# Patient Record
Sex: Male | Born: 1990 | Race: Black or African American | Hispanic: No | Marital: Single | Smoking: Current every day smoker
Health system: Southern US, Community
[De-identification: ages and names within clinical notes are randomized; demographics above are authoritative.]

## PROBLEM LIST (undated history)

## (undated) DIAGNOSIS — B20 Human immunodeficiency virus [HIV] disease: Secondary | ICD-10-CM

## (undated) DIAGNOSIS — K529 Noninfective gastroenteritis and colitis, unspecified: Secondary | ICD-10-CM

## (undated) DIAGNOSIS — B353 Tinea pedis: Secondary | ICD-10-CM

## (undated) DIAGNOSIS — R590 Localized enlarged lymph nodes: Secondary | ICD-10-CM

## (undated) DIAGNOSIS — K1370 Unspecified lesions of oral mucosa: Secondary | ICD-10-CM

## (undated) DIAGNOSIS — M130 Polyarthritis, unspecified: Secondary | ICD-10-CM

## (undated) DIAGNOSIS — A549 Gonococcal infection, unspecified: Secondary | ICD-10-CM

## (undated) DIAGNOSIS — J029 Acute pharyngitis, unspecified: Secondary | ICD-10-CM

## (undated) DIAGNOSIS — Z8619 Personal history of other infectious and parasitic diseases: Secondary | ICD-10-CM

## (undated) HISTORY — DX: Noninfective gastroenteritis and colitis, unspecified: K52.9

## (undated) HISTORY — DX: Tinea pedis: B35.3

## (undated) HISTORY — DX: Localized enlarged lymph nodes: R59.0

## (undated) HISTORY — DX: Human immunodeficiency virus (HIV) disease: B20

## (undated) HISTORY — DX: Unspecified lesions of oral mucosa: K13.70

## (undated) HISTORY — DX: Polyarthritis, unspecified: M13.0

## (undated) HISTORY — DX: Acute pharyngitis, unspecified: J02.9

## (undated) HISTORY — DX: Personal history of other infectious and parasitic diseases: Z86.19

## (undated) HISTORY — DX: Gonococcal infection, unspecified: A54.9

---

## 2013-04-16 ENCOUNTER — Emergency Department (HOSPITAL_COMMUNITY): Payer: Self-pay

## 2013-04-16 ENCOUNTER — Emergency Department (HOSPITAL_COMMUNITY)
Admission: EM | Admit: 2013-04-16 | Discharge: 2013-04-16 | Disposition: A | Discharge status: Home / Self Care | Payer: Self-pay | Attending: Emergency Medicine | Admitting: Emergency Medicine

## 2013-04-16 ENCOUNTER — Encounter (HOSPITAL_COMMUNITY): Payer: Self-pay

## 2013-04-16 DIAGNOSIS — L03039 Cellulitis of unspecified toe: Secondary | ICD-10-CM | POA: Insufficient documentation

## 2013-04-16 DIAGNOSIS — F17200 Nicotine dependence, unspecified, uncomplicated: Secondary | ICD-10-CM | POA: Insufficient documentation

## 2013-04-16 DIAGNOSIS — L02619 Cutaneous abscess of unspecified foot: Secondary | ICD-10-CM | POA: Insufficient documentation

## 2013-04-16 DIAGNOSIS — L03031 Cellulitis of right toe: Secondary | ICD-10-CM

## 2013-04-16 MED ORDER — HYDROCODONE-ACETAMINOPHEN 5-325 MG PO TABS
1.0000 | ORAL_TABLET | Freq: Once | ORAL | Status: AC
  Administered 2013-04-16: 1 via ORAL
  Filled 2013-04-16: qty 1

## 2013-04-16 MED ORDER — CEPHALEXIN 500 MG PO CAPS: 500.0000 mg | ORAL_CAPSULE | Freq: Three times a day (TID) | ORAL | Status: DC

## 2013-04-16 MED ORDER — SULFAMETHOXAZOLE-TRIMETHOPRIM 800-160 MG PO TABS: 1.0000 | ORAL_TABLET | Freq: Two times a day (BID) | ORAL | Status: DC

## 2013-04-16 NOTE — ED Provider Notes (Signed)
CSN: [id number]     Arrival date & time 04/16/13  0222 History   First MD Initiated Contact with Patient 04/16/13 0245     Chief Complaint  Patient presents with  . Foot Pain   (Consider location/radiation/quality/duration/timing/severity/associated sxs/prior Treatment) HPI  22 year old male presents complaining of right foot pain. Patient states he has been self treating his right foot for athlete's foot for the past several months. He also was seen at urgent care 1 month ago for this complaint and was given prescription pills which he has taken it with no improvement. For the past 2 days he noticed gradual sharp throbbing pain to his fourth toe on right foot. Pain has been persistent, worsening with weightbearing. He is here today for evaluations of this toe pain. No complaints of fever, chills. No history of diabetes. No recent trauma.  No past medical history on file. No past surgical history on file. No family history on file. History  Substance Use Topics  . Smoking status: Current Some Day Smoker    Types: Cigars  . Smokeless tobacco: Not on file  . Alcohol Use: Yes    Review of Systems  Constitutional: Negative for fever.  Musculoskeletal: Positive for myalgias.  Skin: Positive for rash.    Allergies  Review of patient's allergies indicates not on file.  Home Medications  No current outpatient prescriptions on file. BP 141/82  Pulse 83  Temp(Src) 97.8 F (36.6 C) (Oral)  Resp 20  SpO2 100% Physical Exam  Nursing note and vitals reviewed. Constitutional: He appears well-developed. No distress.  HENT:  Head: Atraumatic.  Eyes: Conjunctivae are normal.  Musculoskeletal: He exhibits tenderness (R foot: 4th toe is mildly edematous, erythematous, diffusedly tender, with lymphangitis extending to dorsum of foot.  macerated skin to webspace between both side of 4th toe.  No obvious abscess. no fb noted.  nail bed is normal, brisk cap refill).  Psychiatric: He has a  normal mood and affect.    ED Course  Procedures (including critical care time)  Patient with apparent skin infection to his fourth toe on right foot with evidence of lymphangitis. No obvious joint involvement. No obvious abscess noted. Macerated skins around the affected toe, likely the vector of infection.  Will obtain x-rays to rule out bony involvement. Pain medication given.  4:18 AM Xray without bony involvement.  Will d/c with keflex/bactrim abx.  appropriate skin care discussed.  Cellulitis hand out given.  Recommend return in 48 hx if no improvement.    Labs Review Labs Reviewed - No data to display Imaging Review Dg Toe 4th Right  04/16/2013   CLINICAL DATA:  Foot pain. Signs of infection. Rule out osteomyelitis.  EXAM: RIGHT FOURTH TOE  COMPARISON:  None.  FINDINGS: No ulcer is seen. No osseous erosion, demineralization, or focal joint narrowing.  IMPRESSION: No evidence of osseous infection.   Electronically Signed   By: Tiburcio Pea   On: 04/16/2013 03:51    MDM   1. Cellulitis of toe of right foot    BP 127/60  Pulse 66  Temp(Src) 97.8 F (36.6 C) (Oral)  Resp 16  SpO2 99%  I have reviewed nursing notes and vital signs. I personally reviewed the imaging tests through PACS system  I reviewed available ER/hospitalization records thought the EMR     Fayrene Helper, PA-C 04/16/13 0419  Fayrene Helper, PA-C 04/16/13 [phone]

## 2013-04-16 NOTE — ED Provider Notes (Signed)
Medical screening examination/treatment/procedure(s) were performed by non-physician practitioner and as supervising physician I was immediately available for consultation/collaboration.  Sunnie Nielsen, MD 04/16/13 [phone]

## 2013-04-16 NOTE — ED Notes (Signed)
Pt has been using creams and prescription pills to treat athletes foot for one week and is now experiencing increased pain to R foot. Pt states he has noticed a green spot on toe that went away and has skin peeling to toes.

## 2013-04-18 ENCOUNTER — Emergency Department (HOSPITAL_COMMUNITY)
Admission: EM | Admit: 2013-04-18 | Discharge: 2013-04-18 | Disposition: A | Discharge status: Home / Self Care | Payer: Self-pay | Attending: Emergency Medicine | Admitting: Emergency Medicine

## 2013-04-18 ENCOUNTER — Encounter (HOSPITAL_COMMUNITY): Payer: Self-pay | Admitting: Emergency Medicine

## 2013-04-18 DIAGNOSIS — M25473 Effusion, unspecified ankle: Secondary | ICD-10-CM | POA: Insufficient documentation

## 2013-04-18 DIAGNOSIS — L03039 Cellulitis of unspecified toe: Secondary | ICD-10-CM | POA: Insufficient documentation

## 2013-04-18 DIAGNOSIS — L02619 Cutaneous abscess of unspecified foot: Secondary | ICD-10-CM | POA: Insufficient documentation

## 2013-04-18 DIAGNOSIS — L02611 Cutaneous abscess of right foot: Secondary | ICD-10-CM

## 2013-04-18 DIAGNOSIS — M25476 Effusion, unspecified foot: Secondary | ICD-10-CM | POA: Insufficient documentation

## 2013-04-18 DIAGNOSIS — L03031 Cellulitis of right toe: Secondary | ICD-10-CM

## 2013-04-18 DIAGNOSIS — F17200 Nicotine dependence, unspecified, uncomplicated: Secondary | ICD-10-CM | POA: Insufficient documentation

## 2013-04-18 DIAGNOSIS — B353 Tinea pedis: Secondary | ICD-10-CM

## 2013-04-18 MED ORDER — KETOROLAC TROMETHAMINE 30 MG/ML IJ SOLN
30.0000 mg | Freq: Once | INTRAMUSCULAR | Status: AC
Start: 2013-04-18 — End: 2013-04-18
  Administered 2013-04-18: 30 mg via INTRAVENOUS
  Filled 2013-04-18: qty 1

## 2013-04-18 MED ORDER — MORPHINE SULFATE 4 MG/ML IJ SOLN
4.0000 mg | Freq: Once | INTRAMUSCULAR | Status: AC
  Administered 2013-04-18: 4 mg via INTRAVENOUS
  Filled 2013-04-18: qty 1

## 2013-04-18 MED ORDER — CLINDAMYCIN PHOSPHATE 600 MG/50ML IV SOLN
600.0000 mg | Freq: Once | INTRAVENOUS | Status: AC
  Administered 2013-04-18: 600 mg via INTRAVENOUS
  Filled 2013-04-18: qty 50

## 2013-04-18 MED ORDER — HYDROCODONE-ACETAMINOPHEN 5-325 MG PO TABS: ORAL_TABLET | ORAL | Status: DC

## 2013-04-18 MED ORDER — TOLNAFTATE 1 % EX POWD: Freq: Two times a day (BID) | CUTANEOUS | Status: DC

## 2013-04-18 NOTE — ED Notes (Signed)
Pt states he was recently diagnosed with cellulitis of R foot/toes, pt states he has been using meds as directed, feel infection and pain now worse.

## 2013-04-18 NOTE — Discharge Instructions (Signed)
 Cellulitis Cellulitis is an infection of the skin and the tissue beneath it. The infected area is usually red and tender. Cellulitis occurs most often in the arms and lower legs.  CAUSES  Cellulitis is caused by bacteria that enter the skin through cracks or cuts in the skin. The most common types of bacteria that cause cellulitis are Staphylococcus and Streptococcus. SYMPTOMS   Redness and warmth.  Swelling.  Tenderness or pain.  Fever. DIAGNOSIS  Your caregiver can usually determine what is wrong based on a physical exam. Blood tests may also be done. TREATMENT  Treatment usually involves taking an antibiotic medicine. HOME CARE INSTRUCTIONS   Take your antibiotics as directed. Finish them even if you start to feel better.  Keep the infected arm or leg elevated to reduce swelling.  Apply a warm cloth to the affected area up to 4 times per day to relieve pain.  Only take over-the-counter or prescription medicines for pain, discomfort, or fever as directed by your caregiver.  Keep all follow-up appointments as directed by your caregiver. SEEK MEDICAL CARE IF:   You notice red streaks coming from the infected area.  Your red area gets larger or turns dark in color.  Your bone or joint underneath the infected area becomes painful after the skin has healed.  Your infection returns in the same area or another area.  You notice a swollen bump in the infected area.  You develop new symptoms. SEEK IMMEDIATE MEDICAL CARE IF:   You have a fever.  You feel very sleepy.  You develop vomiting or diarrhea.  You have a general ill feeling (malaise) with muscle aches and pains. MAKE SURE YOU:   Understand these instructions.  Will watch your condition.  Will get help right away if you are not doing well or get worse. Document Released: 04/25/2005 Document Revised: 01/15/2012 Document Reviewed: 10/01/2011 [facility] Patient Information 2014 [place], [place].     Athlete's  Foot Athlete's foot (tinea pedis) is a fungal infection of the skin on the feet. It often occurs on the skin between the toes or underneath the toes. It can also occur on the soles of the feet. Athlete's foot is more likely to occur in hot, humid weather. Not washing your feet or changing your socks often enough can contribute to athlete's foot. The infection can spread from person to person (contagious). CAUSES Athlete's foot is caused by a fungus. This fungus thrives in warm, moist places. Most people get athlete's foot by sharing shower stalls, towels, and wet floors with an infected person. People with weakened immune systems, including those with diabetes, may be more likely to get athlete's foot. SYMPTOMS   Itchy areas between the toes or on the soles of the feet.  White, flaky, or scaly areas between the toes or on the soles of the feet.  Tiny, intensely itchy blisters between the toes or on the soles of the feet.  Tiny cuts on the skin. These cuts can develop a bacterial infection.  Thick or discolored toenails. DIAGNOSIS  Your caregiver can usually tell what the problem is by doing a physical exam. Your caregiver may also take a skin sample from the rash area. The skin sample may be examined under a microscope, or it may be tested to see if fungus will grow in the sample. A sample may also be taken from your toenail for testing. TREATMENT  Over-the-counter and prescription medicines can be used to kill the fungus. These medicines are available as  powders or creams. Your caregiver can suggest medicines for you. Fungal infections respond slowly to treatment. You may need to continue using your medicine for several weeks. PREVENTION   Do not share towels.  Wear sandals in wet areas, such as shared locker rooms and shared showers.  Keep your feet dry. Wear shoes that allow air to circulate. Wear cotton or wool socks. HOME CARE INSTRUCTIONS   Take medicines as directed by your  caregiver. Do not use steroid creams on athlete's foot.  Keep your feet clean and cool. Wash your feet daily and dry them thoroughly, especially between your toes.  Change your socks every day. Wear cotton or wool socks. In hot climates, you may need to change your socks 2 to 3 times per day.  Wear sandals or canvas tennis shoes with good air circulation.  If you have blisters, soak your feet in Burow's solution or Epsom salts for 20 to 30 minutes, 2 times a day to dry out the blisters. Make sure you dry your feet thoroughly afterward. SEEK MEDICAL CARE IF:   You have a fever.  You have swelling, soreness, warmth, or redness in your foot.  You are not getting better after 7 days of treatment.  You are not completely cured after 30 days.  You have any problems caused by your medicines. MAKE SURE YOU:   Understand these instructions.  Will watch your condition.  Will get help right away if you are not doing well or get worse. Document Released: 07/13/2000 Document Revised: 10/08/2011 Document Reviewed: 05/04/2011 [facility] Patient Information 2014 [place], [place].   Narcotic and benzodiazepine use may cause drowsiness, slowed breathing or dependence.  Please use with caution and do not drive, operate machinery or watch young children alone while taking them.  Taking combinations of these medications or drinking alcohol will potentiate these effects.

## 2013-04-18 NOTE — ED Provider Notes (Signed)
CSN: [id number]     Arrival date & time 04/18/13  8295 History   First MD Initiated Contact with Patient 04/18/13 [phone]     Chief Complaint  Patient presents with  . Cellulitis   (Consider location/radiation/quality/duration/timing/severity/associated sxs/prior Treatment) HPI Comments: Pt with h/o athletes foot problems along skin had worsening pain and swelling to 4th toe of right foot, seen 2 days ago, I reviewed chart and pt had plain film, given 2 abx, keflex and bactrim.  Pt denies h/o DM, other health problems.  He has been taking tylenol for pain without much relief.  He is wearing sandals, normally wears shoes and socks all day.  He was given pills for athletes foot in the past, has completed all.  He reports last night, pain got worse, sharp, had pus drainage from in between 3rd and 4th toes.    Patient is a 22 y.o. male presenting with lower extremity pain. The history is provided by the patient, medical records and a friend.  Foot Pain This is a new problem. The current episode started more than 2 days ago. The problem occurs constantly. The problem has been gradually worsening.    History reviewed. No pertinent past medical history. History reviewed. No pertinent past surgical history. History reviewed. No pertinent family history. History  Substance Use Topics  . Smoking status: Current Some Day Smoker    Types: Cigars  . Smokeless tobacco: Not on file  . Alcohol Use: Yes     Comment: social    Review of Systems  Constitutional: Negative for fever and chills.  Musculoskeletal: Positive for joint swelling and arthralgias.  Skin: Positive for color change.  All other systems reviewed and are negative.    Allergies  Review of patient's allergies indicates no known allergies.  Home Medications   Current Outpatient Rx  Name  Route  Sig  Dispense  Refill  . acetaminophen (TYLENOL) 500 MG tablet   Oral   Take 1,000 mg by mouth every 6 (six) hours as needed for pain.         . cephALEXin (KEFLEX) 500 MG capsule   Oral   Take 1 capsule (500 mg total) by mouth 3 (three) times daily.   21 capsule   0   . sulfamethoxazole-trimethoprim (BACTRIM DS,SEPTRA DS) 800-160 MG per tablet   Oral   Take 1 tablet by mouth 2 (two) times daily.   10 tablet   0   . HYDROcodone-acetaminophen (NORCO/VICODIN) 5-325 MG per tablet      1-2 tablets po q 6 hours prn moderate to severe pain   20 tablet   0   . tolnaftate (TINACTIN) 1 % powder   Topical   Apply topically 2 (two) times daily.   45 g   0    BP 131/64  Pulse 64  Temp(Src) 97.8 F (36.6 C) (Oral)  Ht 5\' 6"  (1.676 m)  Wt 140 lb (63.504 kg)  BMI 22.61 kg/m2  SpO2 100% Physical Exam  Nursing note and vitals reviewed. Constitutional: He is oriented to person, place, and time. He appears well-developed and well-nourished. No distress.  HENT:  Head: Normocephalic and atraumatic.  Cardiovascular:  Pulses:      Dorsalis pedis pulses are 2+ on the right side.       Posterior tibial pulses are 2+ on the right side.  Pulmonary/Chest: Effort normal. No respiratory distress.  Abdominal: Soft. He exhibits no distension. There is no tenderness. There is no rebound and no guarding.  Musculoskeletal:       Right foot: He exhibits tenderness and swelling.       Feet:  Neurological: He is alert and oriented to person, place, and time. Coordination normal.  Skin: Skin is warm and dry. He is not diaphoretic.    ED Course  Procedures (including critical care time) Labs Review Labs Reviewed - No data to display Imaging Review No results found.    9:19 AM All instructions given . Pt feels improved overall.  Has some minimal upper epigastric pain, suspect not related, no guard or rebound.  Pt has not yet eaten breakfast and has had pills this AM.  Pt is reassured.   Will refer to podiatry and d/c after abx completed. MDM   1. Cellulitis of toe of right foot   2. Abscess of toe of right foot   3.  Tinea pedis, right    Pt with worsening cellulitis, spontaneous drainage of some abscess fluid from between toes and into foot space.  No further fluctuance noted.  With some lymph channel streaking, will give IV dose of clindamycin, clean foot and toes well here, give antifungal powder and stressed keeping foot clean to patient.     Gavin Pound. Oletta Lamas, MD 04/18/13 [phone]

## 2014-10-28 ENCOUNTER — Ambulatory Visit (INDEPENDENT_AMBULATORY_CARE_PROVIDER_SITE_OTHER): Payer: Self-pay

## 2014-10-28 DIAGNOSIS — Z113 Encounter for screening for infections with a predominantly sexual mode of transmission: Secondary | ICD-10-CM

## 2014-10-28 DIAGNOSIS — B20 Human immunodeficiency virus [HIV] disease: Secondary | ICD-10-CM

## 2014-10-28 DIAGNOSIS — Z79899 Other long term (current) drug therapy: Secondary | ICD-10-CM

## 2014-10-28 LAB — CBC WITH DIFFERENTIAL/PLATELET
BASOS PCT: 0 % (ref 0–1)
Basophils Absolute: 0 10*3/uL (ref 0.0–0.1)
Eosinophils Absolute: 0 10*3/uL (ref 0.0–0.7)
Eosinophils Relative: 1 % (ref 0–5)
HEMATOCRIT: 48 % (ref 39.0–52.0)
HEMOGLOBIN: 16.7 g/dL (ref 13.0–17.0)
LYMPHS ABS: 2 10*3/uL (ref 0.7–4.0)
Lymphocytes Relative: 41 % (ref 12–46)
MCH: 28.4 pg (ref 26.0–34.0)
MCHC: 34.8 g/dL (ref 30.0–36.0)
MCV: 81.5 fL (ref 78.0–100.0)
MONO ABS: 0.5 10*3/uL (ref 0.1–1.0)
MONOS PCT: 10 % (ref 3–12)
MPV: 9.3 fL (ref 8.6–12.4)
NEUTROS ABS: 2.3 10*3/uL (ref 1.7–7.7)
Neutrophils Relative %: 48 % (ref 43–77)
Platelets: 348 10*3/uL (ref 150–400)
RBC: 5.89 MIL/uL — ABNORMAL HIGH (ref 4.22–5.81)
RDW: 13.2 % (ref 11.5–15.5)
WBC: 4.8 10*3/uL (ref 4.0–10.5)

## 2014-10-29 LAB — COMPLETE METABOLIC PANEL WITH GFR
ALBUMIN: 5 g/dL (ref 3.5–5.2)
ALT: 17 U/L (ref 0–53)
AST: 17 U/L (ref 0–37)
Alkaline Phosphatase: 57 U/L (ref 39–117)
BUN: 9 mg/dL (ref 6–23)
CALCIUM: 10 mg/dL (ref 8.4–10.5)
CHLORIDE: 103 meq/L (ref 96–112)
CO2: 27 meq/L (ref 19–32)
Creat: 0.84 mg/dL (ref 0.50–1.35)
GFR, Est African American: >89 mL/min
GLUCOSE: 88 mg/dL (ref 70–99)
POTASSIUM: 4.1 meq/L (ref 3.5–5.3)
SODIUM: 139 meq/L (ref 135–145)
TOTAL PROTEIN: 8.3 g/dL (ref 6.0–8.3)
Total Bilirubin: 0.5 mg/dL (ref 0.2–1.2)

## 2014-10-29 LAB — HEPATITIS A ANTIBODY, TOTAL: HEP A TOTAL AB: NONREACTIVE

## 2014-10-29 LAB — LIPID PANEL
CHOL/HDL RATIO: 3.6 ratio
Cholesterol: 138 mg/dL (ref 0–200)
HDL: 38 mg/dL — ABNORMAL LOW (ref >=40)
LDL CALC: 80 mg/dL (ref 0–99)
Triglycerides: 98 mg/dL (ref <150)
VLDL: 20 mg/dL (ref 0–40)

## 2014-10-29 LAB — RPR

## 2014-10-29 LAB — HEPATITIS C ANTIBODY: HCV AB: NEGATIVE

## 2014-10-29 LAB — HEPATITIS B SURFACE ANTIBODY,QUALITATIVE: Hep B S Ab: POSITIVE — AB

## 2014-10-29 LAB — T-HELPER CELL (CD4) - (RCID CLINIC ONLY)
CD4 % Helper T Cell: 26 % — ABNORMAL LOW (ref 33–55)
CD4 T CELL ABS: 490 /uL (ref 400–2700)

## 2014-10-29 LAB — HEPATITIS B SURFACE ANTIGEN: HEP B S AG: NEGATIVE

## 2014-10-29 LAB — HEPATITIS B CORE ANTIBODY, TOTAL: Hep B Core Total Ab: NONREACTIVE

## 2014-10-30 LAB — QUANTIFERON TB GOLD ASSAY (BLOOD)
Interferon Gamma Release Assay: NEGATIVE
QUANTIFERON NIL VALUE: 0.19 [IU]/mL
QUANTIFERON TB AG MINUS NIL: 0.05 [IU]/mL
TB Ag value: 0.24 IU/mL

## 2014-11-01 LAB — HIV-1 RNA ULTRAQUANT REFLEX TO GENTYP+
HIV 1 RNA QUANT: 45990 {copies}/mL — AB (ref <20)
HIV-1 RNA QUANT, LOG: 4.66 {Log} — AB (ref <1.30)

## 2014-11-03 ENCOUNTER — Other Ambulatory Visit: Payer: Self-pay | Admitting: Licensed Clinical Social Worker

## 2014-11-03 MED ORDER — SULFAMETHOXAZOLE-TRIMETHOPRIM 800-160 MG PO TABS: 1.0000 | ORAL_TABLET | Freq: Two times a day (BID) | ORAL | Status: DC

## 2014-11-04 LAB — HLA B*5701: HLA-B 5701 W/RFLX HLA-B HIGH: NEGATIVE

## 2014-11-08 LAB — HIV-1 GENOTYPR PLUS

## 2014-11-09 ENCOUNTER — Telehealth: Payer: Self-pay | Admitting: *Deleted

## 2014-11-09 ENCOUNTER — Ambulatory Visit: Payer: Self-pay | Admitting: Internal Medicine

## 2014-11-09 NOTE — Telephone Encounter (Signed)
Message left on voicemail regarding missed new patient appointment.  Patient advised to call back to reschedule.   Laurell Josephsammy K Marki Frede, RN

## 2014-11-30 ENCOUNTER — Encounter: Payer: Self-pay | Admitting: Internal Medicine

## 2014-11-30 ENCOUNTER — Ambulatory Visit (INDEPENDENT_AMBULATORY_CARE_PROVIDER_SITE_OTHER): Payer: Self-pay | Admitting: Internal Medicine

## 2014-11-30 VITALS — BP 136/95 | HR 69 | Temp 99.5°F | Ht 66.5 in | Wt 139.0 lb

## 2014-11-30 DIAGNOSIS — R110 Nausea: Secondary | ICD-10-CM

## 2014-11-30 DIAGNOSIS — Z113 Encounter for screening for infections with a predominantly sexual mode of transmission: Secondary | ICD-10-CM

## 2014-11-30 DIAGNOSIS — Z23 Encounter for immunization: Secondary | ICD-10-CM

## 2014-11-30 DIAGNOSIS — B20 Human immunodeficiency virus [HIV] disease: Secondary | ICD-10-CM

## 2014-11-30 DIAGNOSIS — Z7189 Other specified counseling: Secondary | ICD-10-CM

## 2014-11-30 DIAGNOSIS — R634 Abnormal weight loss: Secondary | ICD-10-CM

## 2014-11-30 MED ORDER — PROMETHAZINE HCL 25 MG PO TABS: 25.0000 mg | ORAL_TABLET | Freq: Four times a day (QID) | ORAL | Status: DC | PRN

## 2014-11-30 NOTE — Progress Notes (Signed)
Patient ID: David Hoffman, male   DOB: July 29, 1991, 24 y.o.   MRN: [id number]       Patient ID: David Hoffman, male   DOB: 1991/07/22, 24 y.o.   MRN: [id number]  HPI 23yo M with HIV disease, CD 4 count of 490/VL45,990. Hep B immune, not immune to hep A. RPR negative. RF for HIV includes: MSM. HIV was diagnosed in the setting of attempting to donate plasma.  Last tested: found to be positive in march 2016. Last tested negative early January 2015. Had 3 sexual partners in 2015. His main partners x 3 yrs, now started on PreP   He has been in good state of health, works out but states he has poor dietary intake. In the last 4 days, he has had some nausea and roughly 10 days ago had one evening of nightsweats but no longer happening.  Hx of STI or HPV anal warts: 2014 had gonorrhea.   PMHx:  Hx of std Newly diagnosed hiv disease  Family hx: diabetes, hx of schizophrenia in grandmother  Soc hx: works in call center full time, originally from [place], commutes to [employer]. 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 Wants to work for the [employer]. Stopped smoking. occ ETOH.    There are no active problems to display for this patient.   Health Maintenance Due  Topic Date Due  . TETANUS/TDAP  01/25/2011     Review of Systems  nausea +, weight loss, lost 20 # in last 10 months, unintentional. Otherwise 10 point ROS is negative Physical Exam   BP 136/95 mmHg  Pulse 69  Temp(Src) 99.5 F (37.5 C) (Oral)  Ht 5' 6.5" (1.689 m)  Wt 139 lb (63.05 kg)  BMI 22.10 kg/m2 Physical Exam  Constitutional: He is oriented to person, place, and time. He appears well-developed and well-nourished. No distress.  HENT:  Mouth/Throat: Oropharynx is clear and moist. No oropharyngeal exudate.  Cardiovascular: Normal rate, regular rhythm and normal heart sounds. Exam reveals no gallop and no friction rub.  No murmur heard.  Pulmonary/Chest: Effort normal and breath sounds normal. No  respiratory distress. He has no wheezes.  Abdominal: Soft. Bowel sounds are normal. He exhibits no distension. There is no tenderness.  Lymphadenopathy:  He has no cervical adenopathy.  Neurological: He is alert and oriented to person, place, and time.  Skin: Skin is warm and dry. No rash noted. No erythema. Tattoos+ Psychiatric: He has a normal mood and affect. His behavior is normal.    Lab Results  Component Value Date   CD4TCELL 26* 10/28/2014   Lab Results  Component Value Date   CD4TABS 490 10/28/2014   Lab Results  Component Value Date   HIV1RNAQUANT 45990* 10/28/2014   Lab Results  Component Value Date   HEPBSAB POS* 10/28/2014   No results found for: RPR  CBC Lab Results  Component Value Date   WBC 4.8 10/28/2014   RBC 5.89* 10/28/2014   HGB 16.7 10/28/2014   HCT 48.0 10/28/2014   PLT 348 10/28/2014   MCV 81.5 10/28/2014   MCH 28.4 10/28/2014   MCHC 34.8 10/28/2014   RDW 13.2 10/28/2014   LYMPHSABS 2.0 10/28/2014   MONOABS 0.5 10/28/2014   EOSABS 0.0 10/28/2014   BASOSABS 0.0 10/28/2014   BMET Lab Results  Component Value Date   NA 139 10/28/2014   K 4.1 10/28/2014   CL 103 10/28/2014   CO2 27 10/28/2014   GLUCOSE 88 10/28/2014   BUN 9  10/28/2014   CREATININE 0.84 10/28/2014   CALCIUM 10.0 10/28/2014   GFRNONAA >89 10/28/2014   GFRAA >89 10/28/2014     Assessment and Plan  hiv disease= will have him talk to Selena [facility]attenKim for clinical trial. Will be doing study startingin integrase and following bone density  Nausea = will give rx of phenergan to see if that helps. Will work up if it is still persistent at next visit  Health maintenance & promotion =will give hep A #1 today, and pneumococcal today. Discuss getting hpv at next visit. Spent 5 min in face to face time in counseling on health promotion, safe sexual practices.   - will do urine gc/chlamydia  hiv prevention = condoms if to have sex  Weight loss = will encourage for him to have better  dietary intake and will monitor weight at next visit

## 2014-12-01 LAB — URINALYSIS, ROUTINE W REFLEX MICROSCOPIC
Bilirubin Urine: NEGATIVE
GLUCOSE, UA: NEGATIVE mg/dL
HGB URINE DIPSTICK: NEGATIVE
KETONES UR: NEGATIVE mg/dL
LEUKOCYTES UA: NEGATIVE
NITRITE: NEGATIVE
PH: 7.5 (ref 5.0–8.0)
Protein, ur: NEGATIVE mg/dL
SPECIFIC GRAVITY, URINE: 1.023 (ref 1.005–1.030)
UROBILINOGEN UA: 1 mg/dL (ref 0.0–1.0)

## 2014-12-01 LAB — URINE CYTOLOGY ANCILLARY ONLY
Chlamydia: NEGATIVE
Neisseria Gonorrhea: NEGATIVE

## 2014-12-07 ENCOUNTER — Encounter (INDEPENDENT_AMBULATORY_CARE_PROVIDER_SITE_OTHER): Payer: Self-pay | Admitting: *Deleted

## 2014-12-07 VITALS — BP 134/83 | HR 73 | Temp 97.7°F | Resp 16 | Ht 67.0 in | Wt 138.8 lb

## 2014-12-07 DIAGNOSIS — B20 Human immunodeficiency virus [HIV] disease: Secondary | ICD-10-CM

## 2014-12-07 DIAGNOSIS — Z006 Encounter for examination for normal comparison and control in clinical research program: Secondary | ICD-10-CM

## 2014-12-07 NOTE — Progress Notes (Signed)
Subjective:    Patient ID: David Hoffman, male    DOB: 03/26/1991, 24 y.o.   MRN: [id number]  HPI  24 year old [place] man with newly diagnosed HIV, screening visit for [facility] study.  Lab Results  Component Value Date   HIV1RNAQUANT 96759* 10/28/2014   Lab Results  Component Value Date   CD4TABS 490 10/28/2014    HIV genotype no resistance. HIV INSTI not yet done. Hep B negative. HLA b701 negative   Review of Systems  Constitutional: Negative for fever, chills, diaphoresis, activity change, appetite change, fatigue and unexpected weight change.  HENT: Negative for congestion, rhinorrhea, sinus pressure, sneezing, sore throat and trouble swallowing.   Eyes: Negative for photophobia and visual disturbance.  Respiratory: Negative for cough, chest tightness, shortness of breath, wheezing and stridor.   Cardiovascular: Negative for chest pain, palpitations and leg swelling.  Gastrointestinal: Negative for nausea, vomiting, abdominal pain, diarrhea, constipation, blood in stool, abdominal distention and anal bleeding.  Genitourinary: Negative for dysuria, hematuria, flank pain and difficulty urinating.  Musculoskeletal: Negative for myalgias, back pain, joint swelling, arthralgias and gait problem.  Skin: Negative for color change, pallor, rash and wound.  Neurological: Negative for dizziness, tremors, weakness and light-headedness.  Hematological: Negative for adenopathy. Does not bruise/bleed easily.  Psychiatric/Behavioral: Negative for behavioral problems, confusion, sleep disturbance, dysphoric mood, decreased concentration and agitation.       Objective:   Physical Exam  Constitutional: He is oriented to person, place, and time. He appears well-developed and well-nourished.  HENT:  Head: Normocephalic and atraumatic.  Right Ear: Hearing normal.  Left Ear: Hearing normal.  Nose: Nose normal. No mucosal edema.  Mouth/Throat: Uvula is midline, oropharynx is  clear and moist and mucous membranes are normal. He does not have dentures. No oral lesions. No trismus in the jaw. Normal dentition. No dental abscesses, uvula swelling, lacerations or dental caries. No oropharyngeal exudate, posterior oropharyngeal edema, posterior oropharyngeal erythema or tonsillar abscesses.  Eyes: Conjunctivae, EOM and lids are normal. Pupils are equal, round, and reactive to light. Right conjunctiva is not injected. Right conjunctiva has no hemorrhage. Left conjunctiva is not injected. Left conjunctiva has no hemorrhage. No scleral icterus.  Neck: Trachea normal and normal range of motion. Neck supple. No JVD present. No thyroid mass and no thyromegaly present.  Cardiovascular: Normal rate, regular rhythm and normal heart sounds.  Exam reveals no gallop and no friction rub.   No murmur heard. Pulmonary/Chest: Effort normal and breath sounds normal. No respiratory distress. He has no wheezes. He has no rales.  Abdominal: Soft. Bowel sounds are normal. He exhibits no distension. There is no hepatosplenomegaly. There is no tenderness. There is no rebound, no guarding and no CVA tenderness.  Musculoskeletal: Normal range of motion. He exhibits no edema or tenderness.  Neurological: He is alert and oriented to person, place, and time. He has normal reflexes. No cranial nerve deficit. He exhibits normal muscle tone. Coordination normal.  Skin: Skin is warm and dry. No rash noted. No erythema. No pallor.  Psychiatric: He has a normal mood and affect. His speech is normal and behavior is normal. Judgment and thought content normal. Cognition and memory are normal.  Nursing note and vitals reviewed.         Assessment & Plan:   HIV: he has normal exam for screening. I am ordering a separate INSTI gentotype in house to faciliate him enroling more quickly due to lag in turn around with [facility]'s INSTI genotype.  Incredibly low liklihood for INSTI resistance.  Anticipate can enroll  as soon as we have our INSTI genotype back with no R and requisite duplicate [facility] screening labs.

## 2014-12-07 NOTE — Progress Notes (Signed)
[phone] [facility] - Phase 3, Randomized, Double-Blinded Study to evaluate the safety and efficacy of GS-9883/Emtricitabine/Tenofovir Alafenamide versus Abacavir/Dolutegravir/Lamivudine in HIV-1 Infected, Antiretroviral Treatment-Naive Adults. We reviewed the informed consent together. I fully explained the consent, risks, benefits, responsibilities, and answered questions. Participant verbalized understanding and signed the consent witnessed by me. I gave a copy of the signed consent to participant. Medical history, medications, and symptoms were obtained. Complete PE was performed by Dr. Daiva EvesVan Dam (see note below). 12-lead EKG was obtained in supine position. Vital signs were obtained. Fasting labs were also obtained along with urine collection. He received $50 gift card for visit. Next appointment scheduled for January 04, 2015 @ 9am. Tacey HeapElisha Mitchael Luckey RN

## 2014-12-07 NOTE — Addendum Note (Signed)
Addended by: Mariea ClontsGREEN, [place] Mosco D on: 12/07/2014 02:40 PM   Modules accepted: Orders

## 2014-12-08 ENCOUNTER — Other Ambulatory Visit: Payer: Self-pay | Admitting: Infectious Disease

## 2014-12-08 DIAGNOSIS — Z006 Encounter for examination for normal comparison and control in clinical research program: Secondary | ICD-10-CM

## 2014-12-30 ENCOUNTER — Other Ambulatory Visit: Payer: Self-pay

## 2014-12-30 ENCOUNTER — Other Ambulatory Visit: Payer: Self-pay | Admitting: Infectious Disease

## 2014-12-30 DIAGNOSIS — Z006 Encounter for examination for normal comparison and control in clinical research program: Secondary | ICD-10-CM

## 2015-01-04 ENCOUNTER — Encounter: Payer: Self-pay | Admitting: Internal Medicine

## 2015-01-04 ENCOUNTER — Encounter: Payer: Self-pay | Admitting: *Deleted

## 2015-01-04 ENCOUNTER — Ambulatory Visit
Admission: RE | Admit: 2015-01-04 | Discharge: 2015-01-04 | Disposition: A | Discharge status: Home / Self Care | Payer: No Typology Code available for payment source | Source: Ambulatory Visit | Attending: Infectious Disease | Admitting: Infectious Disease

## 2015-01-04 ENCOUNTER — Ambulatory Visit (INDEPENDENT_AMBULATORY_CARE_PROVIDER_SITE_OTHER): Payer: Self-pay | Admitting: Internal Medicine

## 2015-01-04 VITALS — BP 121/76 | HR 66 | Temp 98.3°F | Resp 18 | Wt 138.5 lb

## 2015-01-04 VITALS — BP 121/76 | HR 66 | Temp 98.3°F | Ht 66.5 in | Wt 138.5 lb

## 2015-01-04 DIAGNOSIS — Z006 Encounter for examination for normal comparison and control in clinical research program: Secondary | ICD-10-CM

## 2015-01-04 DIAGNOSIS — B20 Human immunodeficiency virus [HIV] disease: Secondary | ICD-10-CM

## 2015-01-04 NOTE — Assessment & Plan Note (Signed)
No issues.  To start study drug soon.  Follow up per study coordinator

## 2015-01-04 NOTE — Progress Notes (Signed)
   Subjective:    Patient ID: David Hoffman, male    DOB: May 03, 1991, 24 y.o.   MRN: [id number]  HPI He is here for PE for enrollment in study.  To start study drugs.     Review of Systems  All other systems reviewed and are negative.      Objective:   Physical Exam  Constitutional: He appears well-developed and well-nourished. No distress.  HENT:  Mouth/Throat: No oropharyngeal exudate.  Eyes: No scleral icterus.  Cardiovascular: Normal rate, regular rhythm and normal heart sounds.   No murmur heard. Pulmonary/Chest: Effort normal and breath sounds normal. No respiratory distress.  Abdominal: Soft. Bowel sounds are normal. He exhibits no distension. There is no tenderness.  Musculoskeletal: He exhibits no edema.  Lymphadenopathy:    He has no cervical adenopathy.  Skin: No rash noted.  Psychiatric: He has a normal mood and affect.          Assessment & Plan:

## 2015-01-05 NOTE — Progress Notes (Signed)
Bethena Roysysheik is here for entry visit into [facility] [phone]. Eligibility criteria reviewed and confirmed. Also confirmed that he is still willing to participate in study and anxious to start medication. Questionnaires completed prior to other study procedures. PE performed by Dr. Luciana Axeomer. EKG obtained and reviewed by Dr. Luciana Axeomer. Vital signs were obtained. Medications, symptoms, and any other changes were reviewed at this visit. He was randomized to study drug. Bottle numbers were obtained and drug assignment was confirmed by myself and pharmacist. Subject was educated on adherence, dosing, potential side effects, and when to call. He was provided Information Card and dosing card.  I witness him taking his first dose on study. He has been provided information on how to get in touch with us. He verbalized understanding. After completion of Day 1 visit [facility]. He is to return to clinic for week 4 study visit on February 01, 2015. Tacey HeapElisha Epperson RN

## 2015-01-11 ENCOUNTER — Telehealth: Payer: Self-pay | Admitting: Infectious Disease

## 2015-01-11 DIAGNOSIS — R7989 Other specified abnormal findings of blood chemistry: Secondary | ICD-10-CM

## 2015-01-11 NOTE — Telephone Encounter (Signed)
Pt had low level TSH at 0.15, previously .37 low end of normal Would repeat TFT's at next visit

## 2015-01-13 ENCOUNTER — Ambulatory Visit: Payer: Self-pay | Admitting: Internal Medicine

## 2015-01-17 ENCOUNTER — Emergency Department (HOSPITAL_COMMUNITY)
Admission: EM | Admit: 2015-01-17 | Discharge: 2015-01-17 | Disposition: A | Discharge status: Home / Self Care | Payer: Self-pay | Attending: Emergency Medicine | Admitting: Emergency Medicine

## 2015-01-17 ENCOUNTER — Encounter (HOSPITAL_COMMUNITY): Payer: Self-pay | Admitting: *Deleted

## 2015-01-17 DIAGNOSIS — R509 Fever, unspecified: Secondary | ICD-10-CM | POA: Insufficient documentation

## 2015-01-17 DIAGNOSIS — Z8619 Personal history of other infectious and parasitic diseases: Secondary | ICD-10-CM | POA: Insufficient documentation

## 2015-01-17 DIAGNOSIS — R590 Localized enlarged lymph nodes: Secondary | ICD-10-CM | POA: Insufficient documentation

## 2015-01-17 DIAGNOSIS — J029 Acute pharyngitis, unspecified: Secondary | ICD-10-CM | POA: Insufficient documentation

## 2015-01-17 DIAGNOSIS — Z79891 Long term (current) use of opiate analgesic: Secondary | ICD-10-CM | POA: Insufficient documentation

## 2015-01-17 DIAGNOSIS — H5712 Ocular pain, left eye: Secondary | ICD-10-CM | POA: Insufficient documentation

## 2015-01-17 DIAGNOSIS — Z21 Asymptomatic human immunodeficiency virus [HIV] infection status: Secondary | ICD-10-CM | POA: Insufficient documentation

## 2015-01-17 LAB — RAPID STREP SCREEN (MED CTR MEBANE ONLY): Streptococcus, Group A Screen (Direct): NEGATIVE

## 2015-01-17 MED ORDER — PREDNISONE 20 MG PO TABS
60.0000 mg | ORAL_TABLET | Freq: Once | ORAL | Status: AC
  Administered 2015-01-17: 60 mg via ORAL
  Filled 2015-01-17: qty 3

## 2015-01-17 MED ORDER — PREDNISONE 10 MG PO TABS: ORAL_TABLET | ORAL | Status: DC

## 2015-01-17 MED ORDER — PENICILLIN G BENZATHINE 1200000 UNIT/2ML IM SUSP
1.2000 10*6.[IU] | Freq: Once | INTRAMUSCULAR | Status: AC
  Administered 2015-01-17: 1.2 10*6.[IU] via INTRAMUSCULAR
  Filled 2015-01-17: qty 2

## 2015-01-17 NOTE — Discharge Instructions (Signed)
Prednisone as prescribed until all gone. Tylenol or motrin for pain and fever.  Salt water gargles. Follow up with your doctor for recheck. Return if worsening.   Pharyngitis Pharyngitis is redness, pain, and swelling (inflammation) of your pharynx.  CAUSES  Pharyngitis is usually caused by infection. Most of the time, these infections are from viruses (viral) and are part of a cold. However, sometimes pharyngitis is caused by bacteria (bacterial). Pharyngitis can also be caused by allergies. Viral pharyngitis may be spread from person to person by coughing, sneezing, and personal items or utensils (cups, forks, spoons, toothbrushes). Bacterial pharyngitis may be spread from person to person by more intimate contact, such as kissing.  SIGNS AND SYMPTOMS  Symptoms of pharyngitis include:   Sore throat.   Tiredness (fatigue).   Low-grade fever.   Headache.  Joint pain and muscle aches.  Skin rashes.  Swollen lymph nodes.  Plaque-like film on throat or tonsils (often seen with bacterial pharyngitis). DIAGNOSIS  Your health care provider will ask you questions about your illness and your symptoms. Your medical history, along with a physical exam, is often all that is needed to diagnose pharyngitis. Sometimes, a rapid strep test is done. Other lab tests may also be done, depending on the suspected cause.  TREATMENT  Viral pharyngitis will usually get better in 3-4 days without the use of medicine. Bacterial pharyngitis is treated with medicines that kill germs (antibiotics).  HOME CARE INSTRUCTIONS   Drink enough water and fluids to keep your urine clear or pale yellow.   Only take over-the-counter or prescription medicines as directed by your health care provider:   If you are prescribed antibiotics, make sure you finish them even if you start to feel better.   Do not take aspirin.   Get lots of rest.   Gargle with 8 oz of salt water ( tsp of salt per 1 qt of water) as  often as every 1-2 hours to soothe your throat.   Throat lozenges (if you are not at risk for choking) or sprays may be used to soothe your throat. SEEK MEDICAL CARE IF:   You have large, tender lumps in your neck.  You have a rash.  You cough up green, yellow-brown, or bloody spit. SEEK IMMEDIATE MEDICAL CARE IF:   Your neck becomes stiff.  You drool or are unable to swallow liquids.  You vomit or are unable to keep medicines or liquids down.  You have severe pain that does not go away with the use of recommended medicines.  You have trouble breathing (not caused by a stuffy nose). MAKE SURE YOU:   Understand these instructions.  Will watch your condition.  Will get help right away if you are not doing well or get worse. Document Released: 07/16/2005 Document Revised: 05/06/2013 Document Reviewed: 03/23/2013 [facility] Patient Information 2015 [place], [place]. This information is not intended to replace advice given to you by your health care provider. Make sure you discuss any questions you have with your health care provider.

## 2015-01-17 NOTE — ED Notes (Signed)
Pt complains of sore throat, fever, left eye pain since Saturday. Pt states he has taken cold/flu medicine without relief. Pt denies cough/congestion.

## 2015-01-17 NOTE — ED Provider Notes (Signed)
CSN: [id number]     Arrival date & time 01/17/15  1541 History  This chart was scribed for non-physician practitioner Jaynie Crumble, PA-C working with Gilda Crease, MD by Lyndel Safe, ED Scribe. This patient was seen in room WTR8/WTR8 and the patient's care was started at 6:17 PM.   Chief Complaint  Patient presents with  . Sore Throat  . Eye Pain   Patient is a 24 y.o. male presenting with eye pain. The history is provided by the patient. No language interpreter was used.  Eye Pain    HPI Comments: David Hoffman is a 24 y.o. male, with a PMhx of HIV, who presents to the Emergency Department complaining of a gradually worsening, constant, moderate sore throat and fever that began 2 days ago. Pt also complains of left eye pain. He reports his fever was 103 F on Saturday, 102 F on Sunday and 99 F today after he took OTC medication. Temp in the ED currently is 99.6 F. He has tried OTC cold and flu medication with little to no relief. Pt denies cough, congestion, nausea, vomiting, diarrhea, chills, or sick contacts.  Past Medical History  Diagnosis Date  . Tinea pedis   . History of gonorrhea   . HIV disease march 2016 dx   History reviewed. No pertinent past surgical history. Family History  Problem Relation Age of Onset  . Diabetes Mother   . Schizophrenia Maternal Grandmother    History  Substance Use Topics  . Smoking status: Former Smoker    Types: Cigars    Quit date: 11/09/2014  . Smokeless tobacco: Never Used  . Alcohol Use: 0.0 oz/week    0 Standard drinks or equivalent per week     Comment: rare use    Review of Systems  Constitutional: Positive for fever.  HENT: Positive for sore throat. Negative for congestion.   Eyes: Positive for pain.  Respiratory: Negative for cough.   Gastrointestinal: Negative for nausea, vomiting and diarrhea.   Allergies  Review of patient's allergies indicates no known allergies.  Home Medications   Prior to  Admission medications   Medication Sig Start Date End Date Taking? Authorizing Provider  promethazine (PHENERGAN) 25 MG tablet Take 1 tablet (25 mg total) by mouth every 6 (six) hours as needed for nausea or vomiting. 11/30/14   Judyann Munson, MD   BP 110/92 mmHg  Pulse 99  Temp(Src) 99.6 F (37.6 C) (Oral)  Resp 16  SpO2 98% Physical Exam  Constitutional: He is oriented to person, place, and time. He appears well-developed and well-nourished. No distress.  HENT:  Head: Normocephalic.  Right Ear: External ear normal.  Left Ear: External ear normal.  Mouth/Throat: Oropharyngeal exudate present.  Tonsils bilaterally enlarged, erythematous, beefy, with exudate. Uvula is midline. No trismus or swelling under the tongue. No oral lesions.  Eyes: Pupils are equal, round, and reactive to light. Right eye exhibits no discharge. Left eye exhibits no discharge. No scleral icterus.  Neck: Neck supple. No JVD present.  Cardiovascular: Normal rate, regular rhythm and normal heart sounds.   Pulmonary/Chest: Effort normal and breath sounds normal. No respiratory distress. He has no wheezes. He has no rales.  Musculoskeletal: He exhibits no edema.  Lymphadenopathy:    He has cervical adenopathy.  Neurological: He is alert and oriented to person, place, and time.  Skin: Skin is warm. No rash noted. No erythema. No pallor.  Psychiatric: He has a normal mood and affect. His behavior is normal.  Nursing note and vitals reviewed.  ED Course  Procedures  DIAGNOSTIC STUDIES: Oxygen Saturation is 98% on RA, normal by my interpretation.    COORDINATION OF CARE: 6:19 PM Discussed treatment plan which includes to order penicillin and prednisone. Pt acknowledges and agrees to plan.   Labs Review Labs Reviewed  RAPID STREP SCREEN (NOT AT [facility])  CULTURE, GROUP A STREP    Imaging Review No results found.   EKG Interpretation None      MDM   Final diagnoses:  Pharyngitis    patient with  erythematous, swollen tonsils with exudate. No evidence of thrush. Patient states he had fever up to 103 at home. His exam as well as central criteria concerning for strep pharyngitis. I will give him a shot of penicillin here in emergency department. Was short prednisone taper. Follow with primary care doctor. No difficulty swallowing. No evidence of peritonsillar abscess. Also discussed possibility of mono, which is self limiting. Pt voiced understanding. Vs here are normal.    Filed Vitals:   01/17/15 1546  BP: 110/92  Pulse: 99  Temp: 99.6 F (37.6 C)  TempSrc: Oral  Resp: 16  SpO2: 98%    I personally performed the services described in this documentation, which was scribed in my presence. The recorded information has been reviewed and is accurate.    Jaynie Crumble, PA-C 01/17/15 1840  Gilda Crease, MD 01/17/15 2055

## 2015-01-18 ENCOUNTER — Ambulatory Visit: Payer: Self-pay | Admitting: Infectious Disease

## 2015-01-18 ENCOUNTER — Telehealth: Payer: Self-pay | Admitting: Infectious Disease

## 2015-01-18 NOTE — Telephone Encounter (Signed)
Tammy, team,  Benjermin is going to come see me at 1115 today. He has has sore throat multiple joint pains and was seen in ED yesterday  He needs to be tested for oral gonorrhea and a few other things. There was initial concern he might have adverse drug reaction to his anti-virals which are either TRIUMEQ or study drug--I dont think they are but I want to see him and convince myself in person that this is something other than an adverse drug reaction  I dont have a schedule today but wanted to give a heads up

## 2015-01-19 ENCOUNTER — Ambulatory Visit (INDEPENDENT_AMBULATORY_CARE_PROVIDER_SITE_OTHER): Payer: Self-pay | Admitting: Infectious Disease

## 2015-01-19 ENCOUNTER — Inpatient Hospital Stay (HOSPITAL_COMMUNITY)
Admission: AD | Admit: 2015-01-19 | Discharge: 2015-01-24 | DRG: 153 | Disposition: A | Discharge status: Home / Self Care | Payer: Self-pay | Source: Ambulatory Visit | Attending: Internal Medicine | Admitting: Internal Medicine

## 2015-01-19 ENCOUNTER — Inpatient Hospital Stay (HOSPITAL_COMMUNITY): Payer: Self-pay

## 2015-01-19 ENCOUNTER — Ambulatory Visit: Payer: Self-pay | Admitting: Infectious Disease

## 2015-01-19 ENCOUNTER — Encounter: Payer: Self-pay | Admitting: Infectious Disease

## 2015-01-19 ENCOUNTER — Encounter (HOSPITAL_COMMUNITY): Payer: Self-pay | Admitting: *Deleted

## 2015-01-19 VITALS — BP 117/74 | HR 80 | Temp 98.6°F | Wt 138.0 lb

## 2015-01-19 DIAGNOSIS — R51 Headache: Secondary | ICD-10-CM | POA: Diagnosis present

## 2015-01-19 DIAGNOSIS — J029 Acute pharyngitis, unspecified: Secondary | ICD-10-CM | POA: Diagnosis present

## 2015-01-19 DIAGNOSIS — Z833 Family history of diabetes mellitus: Secondary | ICD-10-CM

## 2015-01-19 DIAGNOSIS — M130 Polyarthritis, unspecified: Secondary | ICD-10-CM

## 2015-01-19 DIAGNOSIS — Z21 Asymptomatic human immunodeficiency virus [HIV] infection status: Secondary | ICD-10-CM | POA: Diagnosis present

## 2015-01-19 DIAGNOSIS — H9190 Unspecified hearing loss, unspecified ear: Secondary | ICD-10-CM | POA: Diagnosis present

## 2015-01-19 DIAGNOSIS — Z87891 Personal history of nicotine dependence: Secondary | ICD-10-CM

## 2015-01-19 DIAGNOSIS — Z7952 Long term (current) use of systemic steroids: Secondary | ICD-10-CM

## 2015-01-19 DIAGNOSIS — R519 Headache, unspecified: Secondary | ICD-10-CM | POA: Insufficient documentation

## 2015-01-19 DIAGNOSIS — I889 Nonspecific lymphadenitis, unspecified: Secondary | ICD-10-CM | POA: Insufficient documentation

## 2015-01-19 DIAGNOSIS — R109 Unspecified abdominal pain: Secondary | ICD-10-CM

## 2015-01-19 DIAGNOSIS — B20 Human immunodeficiency virus [HIV] disease: Secondary | ICD-10-CM | POA: Diagnosis present

## 2015-01-19 DIAGNOSIS — A5486 Gonococcal sepsis: Secondary | ICD-10-CM

## 2015-01-19 DIAGNOSIS — A549 Gonococcal infection, unspecified: Secondary | ICD-10-CM

## 2015-01-19 DIAGNOSIS — Z79899 Other long term (current) drug therapy: Secondary | ICD-10-CM

## 2015-01-19 DIAGNOSIS — A545 Gonococcal pharyngitis: Principal | ICD-10-CM | POA: Diagnosis present

## 2015-01-19 DIAGNOSIS — R1084 Generalized abdominal pain: Secondary | ICD-10-CM

## 2015-01-19 DIAGNOSIS — A539 Syphilis, unspecified: Secondary | ICD-10-CM | POA: Diagnosis present

## 2015-01-19 HISTORY — DX: Polyarthritis, unspecified: M13.0

## 2015-01-19 HISTORY — DX: Gonococcal infection, unspecified: A54.9

## 2015-01-19 HISTORY — DX: Acute pharyngitis, unspecified: J02.9

## 2015-01-19 LAB — COMPREHENSIVE METABOLIC PANEL
ALK PHOS: 62 U/L (ref 38–126)
ALT: 29 U/L (ref 17–63)
ANION GAP: 8 (ref 5–15)
AST: 28 U/L (ref 15–41)
Albumin: 3.8 g/dL (ref 3.5–5.0)
BUN: 15 mg/dL (ref 6–20)
CALCIUM: 8.9 mg/dL (ref 8.9–10.3)
CO2: 26 mmol/L (ref 22–32)
CREATININE: 0.96 mg/dL (ref 0.61–1.24)
Chloride: 100 mmol/L — ABNORMAL LOW (ref 101–111)
GFR calc non Af Amer: >60 mL/min (ref >60)
Glucose, Bld: 139 mg/dL — ABNORMAL HIGH (ref 65–99)
POTASSIUM: 4.1 mmol/L (ref 3.5–5.1)
Sodium: 134 mmol/L — ABNORMAL LOW (ref 135–145)
TOTAL PROTEIN: 7.9 g/dL (ref 6.5–8.1)
Total Bilirubin: 0.7 mg/dL (ref 0.3–1.2)

## 2015-01-19 LAB — CBC WITH DIFFERENTIAL/PLATELET
Basophils Absolute: 0 10*3/uL (ref 0.0–0.1)
Basophils Relative: 0 % (ref 0–1)
Eosinophils Absolute: 0 10*3/uL (ref 0.0–0.7)
Eosinophils Relative: 0 % (ref 0–5)
HCT: 41.6 % (ref 39.0–52.0)
Hemoglobin: 14.2 g/dL (ref 13.0–17.0)
LYMPHS ABS: 1.5 10*3/uL (ref 0.7–4.0)
LYMPHS PCT: 10 % — AB (ref 12–46)
MCH: 28.7 pg (ref 26.0–34.0)
MCHC: 34.1 g/dL (ref 30.0–36.0)
MCV: 84 fL (ref 78.0–100.0)
Monocytes Absolute: 1.4 10*3/uL — ABNORMAL HIGH (ref 0.1–1.0)
Monocytes Relative: 9 % (ref 3–12)
NEUTROS PCT: 81 % — AB (ref 43–77)
Neutro Abs: 12.8 10*3/uL — ABNORMAL HIGH (ref 1.7–7.7)
PLATELETS: 299 10*3/uL (ref 150–400)
RBC: 4.95 MIL/uL (ref 4.22–5.81)
RDW: 12.4 % (ref 11.5–15.5)
WBC: 15.8 10*3/uL — AB (ref 4.0–10.5)

## 2015-01-19 LAB — CULTURE, GROUP A STREP

## 2015-01-19 MED ORDER — ACETAMINOPHEN 650 MG RE SUPP: 650.0000 mg | Freq: Four times a day (QID) | RECTAL | Status: DC | PRN

## 2015-01-19 MED ORDER — HYDROCODONE-ACETAMINOPHEN 5-325 MG PO TABS
2.0000 | ORAL_TABLET | Freq: Four times a day (QID) | ORAL | Status: DC | PRN
  Administered 2015-01-20 – 2015-01-23 (×5): 2 via ORAL
  Filled 2015-01-19 (×6): qty 2

## 2015-01-19 MED ORDER — ONDANSETRON HCL 4 MG/2ML IJ SOLN
4.0000 mg | Freq: Four times a day (QID) | INTRAMUSCULAR | Status: DC | PRN
  Administered 2015-01-19 – 2015-01-21 (×3): 4 mg via INTRAVENOUS
  Filled 2015-01-19 (×3): qty 2

## 2015-01-19 MED ORDER — MAGIC MOUTHWASH W/LIDOCAINE
5.0000 mL | Freq: Four times a day (QID) | ORAL | Status: DC | PRN
  Administered 2015-01-20 – 2015-01-23 (×3): 5 mL via ORAL
  Filled 2015-01-19 (×5): qty 5

## 2015-01-19 MED ORDER — SODIUM CHLORIDE 0.9 % IV SOLN
INTRAVENOUS | Status: DC
  Administered 2015-01-19: 18:00:00 via INTRAVENOUS

## 2015-01-19 MED ORDER — CEFTRIAXONE SODIUM IN DEXTROSE 40 MG/ML IV SOLN
2.0000 g | INTRAVENOUS | Status: AC
  Administered 2015-01-19 – 2015-01-24 (×6): 2 g via INTRAVENOUS
  Filled 2015-01-19 (×6): qty 50

## 2015-01-19 MED ORDER — AZITHROMYCIN 500 MG PO TABS
1000.0000 mg | ORAL_TABLET | Freq: Every day | ORAL | Status: DC
  Administered 2015-01-19: 1000 mg via ORAL
  Filled 2015-01-19 (×2): qty 2

## 2015-01-19 MED ORDER — ABACAVIR-DOLUTEGRAVIR-LAMIVUD 600-50-300 MG PO TABS
2.0000 | ORAL_TABLET | Freq: Every day | ORAL | Status: DC
  Administered 2015-01-19 – 2015-01-23 (×5): 2 via ORAL

## 2015-01-19 MED ORDER — ACETAMINOPHEN 325 MG PO TABS
650.0000 mg | ORAL_TABLET | Freq: Four times a day (QID) | ORAL | Status: DC | PRN
  Administered 2015-01-19 – 2015-01-21 (×2): 650 mg via ORAL
  Filled 2015-01-19 (×2): qty 2

## 2015-01-19 MED ORDER — ONDANSETRON HCL 4 MG PO TABS: 4.0000 mg | ORAL_TABLET | Freq: Four times a day (QID) | ORAL | Status: DC | PRN

## 2015-01-19 MED ORDER — ENOXAPARIN SODIUM 40 MG/0.4ML ~~LOC~~ SOLN
40.0000 mg | SUBCUTANEOUS | Status: DC
  Administered 2015-01-19 – 2015-01-22 (×3): 40 mg via SUBCUTANEOUS
  Filled 2015-01-19 (×6): qty 0.4

## 2015-01-19 MED ORDER — IOHEXOL 300 MG/ML  SOLN
100.0000 mL | Freq: Once | INTRAMUSCULAR | Status: AC | PRN
  Administered 2015-01-19: 100 mL via INTRAVENOUS

## 2015-01-19 MED ORDER — IOHEXOL 300 MG/ML  SOLN
25.0000 mL | INTRAMUSCULAR | Status: AC
Start: 2015-01-19 — End: 2015-01-19
  Administered 2015-01-19 (×2): 25 mL via ORAL

## 2015-01-19 MED ORDER — KETOROLAC TROMETHAMINE 15 MG/ML IJ SOLN
15.0000 mg | Freq: Four times a day (QID) | INTRAMUSCULAR | Status: DC | PRN
  Administered 2015-01-21 – 2015-01-23 (×6): 15 mg via INTRAVENOUS
  Filled 2015-01-19 (×6): qty 1

## 2015-01-19 NOTE — H&P (Signed)
[facility] History and Physical  David Hoffman PPJ:[id number] DOB: 05-30-1991 DOA: 01/19/2015  Referring physician: Dr  Algis Liming PCP: follows with Dr Zenaida Niece dam in the office  Chief Complaint:  Fever with polyarthritis x3 days Tonsillopharyngitis x 3-4 days  HPI:  24 year old male with recently diagnosed HIV (sexually acquired), CD4 490 and viral load of 46k currently enrolled into [place] study 1489 which is GS 1883 (Integrase strand transfer inhibitor, emtricitabine, tenofovir alaflenamide, vs TRIUMEQ (it is double blinded study so he takes two pills every day) started on meds 2 weeks back , was seen in the ED on 6/20 for moderate sore throat with fever since 6/18 with tmax of 103F at home. she was found to have swollen erythematous tonsils with exudate. Rapid Strep was negative. He was given a shot of penicillin and discharged on short tapering course of prednisone. However patient continued to have sore throat with subjective fevers and chills. He had mild pain in his knee and hip joints when presented to the ED which became worsened and also involved his bilateral elbows and his wrists. He reports some headache and mild blurred vision as well. Patient seen in the ID clinic today and given persistent symptoms with polyarthralgias suspicious of disseminated gonococcal infection hospitalists admission requested. Patient also has gonorrhea in the past.  Patient denies dizziness, nausea, vomiting, chest pain, palpitations, SOB,  bowel or urinary symptoms. Denies change in weight .  Review of Systems:  Constitutional: Denies fever, chills, diaphoresis, appetite change and fatigue.  HEENT: Blurred vision +,sore throat and congestion, throat pain with mild difficulty swallowing..   Denies photophobia, eye pain, redness, hearing loss, ear pain, hinorrhea, sneezing, mouth sores,neck pain, neck stiffness and tinnitus.   Respiratory: Denies SOB, DOE, cough, chest tightness,  and wheezing.    Cardiovascular: Denies chest pain, palpitations and leg swelling.  Gastrointestinal: abdominal  Pain+,  Denies nausea, vomiting,  diarrhea, constipation, blood in stool and abdominal distention.  Genitourinary: Denies dysuria, urgency, frequency, hematuria, flank pain and difficulty urinating.  denies penile discharge. Endocrine: Denies: hot or cold intolerance,  polyuria, polydipsia. Musculoskeletal: Polyarthralgia , rt sacroiliac  joint pain, Denies myalgias and gait problem.  Skin: Denies pallor, rash and wound.  Neurological: Denies dizziness, seizures, syncope, weakness, light-headedness, numbness and headaches+  Hematological: Denies adenopathy.  Psychiatric/Behavioral: Denies confusion  Past Medical History  Diagnosis Date  . Tinea pedis   . History of gonorrhea   . HIV disease march 2016 dx  . Exudative pharyngitis 01/19/2015  . 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 Gonorrhea 01/19/2015   No past surgical history on file. Social History:  reports that he quit smoking about 2 months ago. His smoking use included Cigars. He has never used smokeless tobacco. He reports that he drinks alcohol. He reports that he uses illicit drugs (Marijuana).  No Known Allergies  Family History  Problem Relation Age of Onset  . Diabetes Mother   . Schizophrenia Maternal Grandmother     Prior to Admission medications   Medication Sig Start Date End Date Taking? Authorizing Provider  acetaminophen (TYLENOL) 500 MG tablet Take 1,000 mg by mouth every 6 (six) hours as needed for mild pain, moderate pain or fever.   Yes Historical Provider, MD  Menthol 5.8 MG LOZG Use as directed 1 lozenge in the mouth or throat as needed (cold sore).   Yes Historical Provider, MD  Phenylephrine-Pheniramine-DM [facility] COLD & COUGH PO) Take 1 Package by mouth every 8 (eight) hours as needed (sore throat).  Yes Historical Provider, MD  predniSONE (DELTASONE) 10 MG tablet Take 10-50 mg by mouth See admin  instructions. 5 tabs on day 1  4 tabs on day 2 3 tabs on day 3 4 tabs on day 4 5 tabs on day 5   Yes Historical Provider, MD  PRESCRIPTION MEDICATION Take 2 tablets by mouth daily. Pt is a part of a HIV study drug- [phone] by Dr. Vonita Moss Historical Provider, MD     Physical Exam:  Filed Vitals:   01/19/15 1456  BP: 121/71  Pulse: 97  Temp: 98.5 F (36.9 C)  TempSrc: Oral  Resp: 18  SpO2: 99%    Constitutional: Vital signs reviewed. Young male in no acute distress HEENT: no pallor, no icterus, moist oral mucosa, bilateral tonsillar exudates with tender cervical lymphadenopathy, no oral thrush no cervical lymphadenopathy Cardiovascular: Loud S1, normal S2, no murmurs rub or gallop Chest: CTAB, no wheezes, rales, or rhonchi Abdominal: Soft. Right upper quadrant tenderness non-distended, bowel sounds are normal, no masses, organomegaly, or guarding present. Tender to pressure over right sacroiliac joint (patient had received IM penicillin injection around the site) GU: no CVA tenderness Ext: warm, no edema Neurological: Alert and Oriented, nonfocal  Labs on Admission:  Pending  Chest x-ray: pending  EKG: Not done  Assessment/Plan Principal Problem:   DGI (disseminated gonococcal infection) Admit to MedSurg. No signs of sepsis at present. Check CBC, compensative metabolic panel and blood culture. -Started on IV Rocephin 2 g daily. Order 1 g of azithromycin by mouth x 1 dose per ID recommendations.  -GC probe sent ID clinic and will follow. Patient has history of gonorrhea several years back. Last unprotected intercourse was about 4 months back. -When necessary Tylenol for fever. Pain control with when necessary Toradol and Vicodin. IV hydration with normal saline. -Check CT scan of the abdomen and pelvis given right upper quadrant tenderness and right sacroiliac tenderness. -Dr Algis Liming will be following patient while in the hospital.  Active Problems:   HIV  disease Diagnosed in March 2016 currently on a study drug which he has brought from home and will be resumed.    Exudative pharyngitis Continue with Rocephin and azithromycin. Will order Magic mouthwash with lidocaine.       Diet: Regular  DVT prophylaxis: sq lovenox   Code Status: full code  Family Communication: discussed with patient and his brother bedside Disposition Plan: Admit to MedSurg. Home once improved  Tzipora Mcinroy [facility] Pager [phone]  Total time spent on admission :70 minutes  If 7PM-7AM, please contact night-coverage [email or web address] Password [facility] 01/19/2015, 3:19 PM

## 2015-01-19 NOTE — Progress Notes (Signed)
Subjective:    Patient ID: David Hoffman, male    DOB: Dec 28, 1990, 24 y.o.   MRN: [id number]  HPI  24 year old HIV + man with recently diagnosed HIV, and enrolled into [place] study 1489 which is GS 1883 (Integrase strand transfer inhibitor, emtricitabine, tenofovir alaflenamide, vs TRIUMEQ (it is double blinded study so he takes two pills every day) started on meds 2 weeks ago. He has hx of + gonorrhea which he acquired from his monogamous partner (who I also believe gave pt HIV)  On Saturday he develoeped severe sore throat after eating [place] food that failed to resolve, then high fevers to 102-103 and disseminated arthritic pain with pain in wrists, knees, elbows and lower back.   He called Tacey Heap our study coordinator and we arranged for him to be seen on Tuesday but he actually went to ED at [facility] where they observed exudative pharyngitis and did rapid strep test which was negative, and culture is growing a non beta hemolytic strep species. He has given PCN IM and prednisone but has felt no better and still with severe throat pain joint pains and high fevers.  I am concerned he may have DGI, vs less likely adverse drug reaction.    Review of Systems  Constitutional: Positive for fever, diaphoresis, activity change, appetite change and fatigue. Negative for chills and unexpected weight change.  HENT: Positive for sore throat. Negative for congestion, rhinorrhea, sinus pressure, sneezing and trouble swallowing.   Eyes: Negative for photophobia and visual disturbance.  Respiratory: Negative for cough, chest tightness, shortness of breath, wheezing and stridor.   Cardiovascular: Negative for chest pain, palpitations and leg swelling.  Gastrointestinal: Negative for nausea, vomiting, abdominal pain, diarrhea, constipation, blood in stool, abdominal distention and anal bleeding.  Genitourinary: Negative for dysuria, hematuria, flank pain and difficulty urinating.    Musculoskeletal: Positive for back pain, joint swelling and arthralgias. Negative for myalgias and gait problem.  Skin: Negative for color change, pallor, rash and wound.  Neurological: Negative for dizziness, tremors, weakness and light-headedness.  Hematological: Negative for adenopathy. Does not bruise/bleed easily.  Psychiatric/Behavioral: Negative for behavioral problems, confusion, sleep disturbance, dysphoric mood, decreased concentration and agitation.       Objective:   Physical Exam  Constitutional: He is oriented to person, place, and time. He appears well-developed and well-nourished.  HENT:  Head: Normocephalic and atraumatic.  Mouth/Throat: Oropharyngeal exudate, posterior oropharyngeal edema and posterior oropharyngeal erythema present.  Eyes: Conjunctivae and EOM are normal.  Neck: Normal range of motion. Neck supple.  Cardiovascular: Normal rate and regular rhythm.   Pulmonary/Chest: Effort normal. No respiratory distress. He has no wheezes.  Abdominal: Soft. He exhibits no distension.  Musculoskeletal: Normal range of motion. He exhibits no edema.       Right elbow: He exhibits no effusion. Tenderness found.       Left elbow: He exhibits no effusion. Tenderness found.       Right knee: He exhibits swelling. He exhibits no effusion. Tenderness found.       Left knee: He exhibits no swelling and no effusion. Tenderness found.       Back:  Neurological: He is alert and oriented to person, place, and time.  Skin: Skin is warm and dry. No rash noted. No erythema. No pallor.          Assessment & Plan:   Exudative pharyngitis with diffuse polyarticular arthritis and high fevers in man with prior hx of Gonorrhea. This is  highly suspicious for DGI  --I have sent oropharynx swab for GC probe in our clinic --I am trying to have him directly admitted to [facility] --on admission I would like to have   Blood cultures x 2 sites obtained CBC c diff CMP  taken  After this is done he should be started on Ceftriaxone 2grams daily and given one time dose of Azithromycin 1gram orally  I would then follow him daily in the hospital  If he has DGI he should respond to parenteral therapy (note conceivable the PCN may interfere with GC probe recovery but hopefully it will be +)  Otherwise if this is not DGI, or another disseminated bacterial or viral infection then he will not improve and we will have to worry about an adverse drug reaction to study drug (which he has been tolerating without problems)  (I would LIKE Him to continue study drug for now however and he will need his TWO bottles of pills from home brought with him for admission and be allowed to take both daily  HIV: see above and continue TWO pills daily from study  I spent greater than 40 minutes with the patient including greater than 50% of time in face to face counsel of the patient possible DGI, symptoms and in coordination of their care with [facility].

## 2015-01-19 NOTE — Telephone Encounter (Signed)
Patient called back this morning stating he has 103 temp and is "shaking all over". Requesting appt, Dr. Daiva Eves will see him today at 11:30. David Hoffman

## 2015-01-20 DIAGNOSIS — A549 Gonococcal infection, unspecified: Secondary | ICD-10-CM

## 2015-01-20 DIAGNOSIS — R519 Headache, unspecified: Secondary | ICD-10-CM | POA: Insufficient documentation

## 2015-01-20 DIAGNOSIS — I889 Nonspecific lymphadenitis, unspecified: Secondary | ICD-10-CM | POA: Insufficient documentation

## 2015-01-20 DIAGNOSIS — R51 Headache: Secondary | ICD-10-CM

## 2015-01-20 LAB — CBC WITH DIFFERENTIAL/PLATELET
Basophils Absolute: 0 10*3/uL (ref 0.0–0.1)
Basophils Relative: 0 % (ref 0–1)
EOS ABS: 0 10*3/uL (ref 0.0–0.7)
Eosinophils Relative: 0 % (ref 0–5)
HCT: 41.6 % (ref 39.0–52.0)
HEMOGLOBIN: 13.8 g/dL (ref 13.0–17.0)
Lymphocytes Relative: 19 % (ref 12–46)
Lymphs Abs: 2.1 10*3/uL (ref 0.7–4.0)
MCH: 28.2 pg (ref 26.0–34.0)
MCHC: 33.2 g/dL (ref 30.0–36.0)
MCV: 84.9 fL (ref 78.0–100.0)
MONOS PCT: 17 % — AB (ref 3–12)
Monocytes Absolute: 1.9 10*3/uL — ABNORMAL HIGH (ref 0.1–1.0)
NEUTROS PCT: 64 % (ref 43–77)
Neutro Abs: 7.2 10*3/uL (ref 1.7–7.7)
Platelets: 312 10*3/uL (ref 150–400)
RBC: 4.9 MIL/uL (ref 4.22–5.81)
RDW: 12.6 % (ref 11.5–15.5)
WBC: 11.2 10*3/uL — AB (ref 4.0–10.5)

## 2015-01-20 LAB — CYTOLOGY, (ORAL, ANAL, URETHRAL) ANCILLARY ONLY
Chlamydia: NEGATIVE
Neisseria Gonorrhea: POSITIVE — AB

## 2015-01-20 MED ORDER — MENTHOL 3 MG MT LOZG
1.0000 | LOZENGE | OROMUCOSAL | Status: DC | PRN
  Administered 2015-01-21 – 2015-01-23 (×3): 3 mg via ORAL
  Filled 2015-01-20: qty 9

## 2015-01-20 MED ORDER — SODIUM CHLORIDE 0.9 % IV SOLN
INTRAVENOUS | Status: DC
  Administered 2015-01-21 – 2015-01-22 (×3): via INTRAVENOUS

## 2015-01-20 MED ORDER — LIDOCAINE VISCOUS 2 % MT SOLN
15.0000 mL | OROMUCOSAL | Status: DC | PRN
  Filled 2015-01-20: qty 15

## 2015-01-20 MED ORDER — LIDOCAINE VISCOUS 2 % MT SOLN
15.0000 mL | Freq: Once | OROMUCOSAL | Status: AC
  Administered 2015-01-20: 15 mL via OROMUCOSAL
  Filled 2015-01-20: qty 15

## 2015-01-20 MED ORDER — SODIUM CHLORIDE 0.9 % IV BOLUS (SEPSIS)
500.0000 mL | Freq: Once | INTRAVENOUS | Status: AC
  Administered 2015-01-20: 500 mL via INTRAVENOUS

## 2015-01-20 NOTE — Progress Notes (Signed)
[facility]    Subjective: Severe headache difficulty hearing, his sore throat is no better, joint pains perhaps a little better   Antibiotics:  Anti-infectives    Start     Dose/Rate Route Frequency Ordered Stop   01/19/15 1800  Study Medication     2 tablet Oral Daily 01/19/15 1519     01/19/15 1600  cefTRIAXone (ROCEPHIN) 2 g in dextrose 5 % 50 mL IVPB - Premix     2 g 100 mL/hr over 30 Minutes Intravenous Every 24 hours 01/19/15 1541     01/19/15 1600  azithromycin (ZITHROMAX) tablet 1,000 mg  Status:  Discontinued     1,000 mg Oral Daily 01/19/15 1541 01/20/15 0920      Medications: Scheduled Meds: . cefTRIAXone (ROCEPHIN)  IV  2 g Intravenous Q24H  . enoxaparin (LOVENOX) injection  40 mg Subcutaneous Q24H  . lidocaine  15 mL Mouth/Throat Once  . Abacavir-Dolutegravir-Lamivud  2 tablet Oral Daily   Continuous Infusions: . sodium chloride 75 mL/hr at 01/20/15 1040   PRN Meds:.acetaminophen **OR** acetaminophen, HYDROcodone-acetaminophen, ketorolac, lidocaine, magic mouthwash w/lidocaine, menthol-cetylpyridinium, ondansetron **OR** ondansetron (ZOFRAN) IV    Objective: Weight change:   Intake/Output Summary (Last 24 hours) at 01/20/15 1153 Last data filed at 01/19/15 1700  Gross per 24 hour  Intake    240 ml  Output      0 ml  Net    240 ml   Blood pressure 113/50, pulse 87, temperature 100.7 F (38.2 C), temperature source Oral, resp. rate 18, height 5' 6.5" (1.689 m), weight 138 lb (62.596 kg), SpO2 100 %. Temp:  [98.5 F (36.9 C)-100.7 F (38.2 C)] 100.7 F (38.2 C) (06/23 0700) Pulse Rate:  [84-97] 87 (06/23 0700) Resp:  [18] 18 (06/23 0700) BP: (113-121)/(50-71) 113/50 mmHg (06/23 0700) SpO2:  [99 %-100 %] 100 % (06/23 0700) Weight:  [138 lb (62.596 kg)] 138 lb (62.596 kg) (06/22 1456)  Physical Exam: General: Alert and awake, oriented x3, not in any acute distress. HEENT: anicteric sclera,  EOMI, inlfamed  tonsils oropharyx with exudate CVS regular rate, normal r,  no murmur rubs or gallops Chest: clear to auscultation bilaterally, no wheezing, rales or rhonchi Abdomen: soft nontender, nondistended, normal bowel sounds, Extremities: elbows, wrists, knees all hurt and are warm and tender but no frank effusion Skin: no rashes Lymph: cervical lymphadenopathy Neuro: nonfocal  CBC:  CBC Latest Ref Rng 01/19/2015 10/28/2014  WBC 4.0 - 10.5 K/uL 15.8(H) 4.8  Hemoglobin 13.0 - 17.0 g/dL 40.9 81.1  Hematocrit 91.4 - 52.0 % 41.6 48.0  Platelets 150 - 400 K/uL 299 348       BMET  Recent Labs  01/19/15 1610  NA 134*  K 4.1  CL 100*  CO2 26  GLUCOSE 139*  BUN 15  CREATININE 0.96  CALCIUM 8.9     Liver Panel   Recent Labs  01/19/15 1610  PROT 7.9  ALBUMIN 3.8  AST 28  ALT 29  ALKPHOS 62  BILITOT 0.7       Sedimentation Rate No results for input(s): ESRSEDRATE in the last 72 hours. C-Reactive Protein No results for input(s): CRP in the last 72 hours.  Micro Results: Recent Results (from the past 720 hour(s))  Rapid strep screen   (If patient has fever and/or without cough or runny nose)     Status: None   Collection Time: 01/17/15  3:44 PM  Result  Value Ref Range Status   Streptococcus, Group A Screen (Direct) NEGATIVE NEGATIVE Final    Comment: (NOTE) A Rapid Antigen test may result negative if the antigen level in the sample is below the detection level of this test. The FDA has not cleared this test as a stand-alone test therefore the rapid antigen negative result has reflexed to a Group A Strep culture.   Culture, Group A Strep     Status: Abnormal   Collection Time: 01/17/15  3:44 PM  Result Value Ref Range Status   Strep A Culture Comment (A)  Final    Comment: (NOTE) Beta-hemolytic colonies, not group A Streptococcus isolated. Performed At: [facility] [address] [place], [place] [id number] Mila Homer MD RJ:[id number]   Culture,  blood (routine x 2)     Status: None (Preliminary result)   Collection Time: 01/19/15  4:10 PM  Result Value Ref Range Status   Specimen Description BLOOD RIGHT ARM  Final   Special Requests BOTTLES DRAWN AEROBIC AND ANAEROBIC 10CC  Final   Culture   Final    NO GROWTH < 24 HOURS Performed at [facility] [address]    Report Status PENDING  Incomplete  Culture, blood (routine x 2)     Status: None (Preliminary result)   Collection Time: 01/19/15  4:10 PM  Result Value Ref Range Status   Specimen Description BLOOD LEFT ARM  Final   Special Requests BOTTLES DRAWN AEROBIC AND ANAEROBIC 10CC  Final   Culture   Final    NO GROWTH < 24 HOURS Performed at [facility]    Report Status PENDING  Incomplete    Studies/Results: Ct Abdomen Pelvis W Contrast  01/19/2015   CLINICAL DATA:  Abdominal pain. Disseminated gonococcal infection. HIV disease.  EXAM: CT ABDOMEN AND PELVIS WITH CONTRAST  TECHNIQUE: Multidetector CT imaging of the abdomen and pelvis was performed using the standard protocol following bolus administration of intravenous contrast.  CONTRAST:  1 OMNIPAQUE IOHEXOL 300 MG/ML SOLN, OMNIPAQUE IOHEXOL 300 MG/ML SOLN  COMPARISON:  None.  FINDINGS: Lower chest:  Normal.  Hepatobiliary: Normal.  Pancreas: Normal.  Spleen: Normal.  Adrenals/Urinary Tract: Normal except for 4 mm cyst in the mid right kidney.  Stomach/Bowel: Normal.  Vascular/Lymphatic: Normal.  Reproductive: Normal.  Other: No free air or free fluid.  Musculoskeletal: Prominent central disc protrusion at L4-5. Osseous structures otherwise appear normal.  IMPRESSION: No acute abdominal or pelvic abnormality. Prominent central disc protrusion at L4-5.   Electronically Signed   By: Francene Boyers M.D.   On: 01/19/2015 20:37      Assessment/Plan:  Principal Problem:   DGI (disseminated gonococcal infection) Active Problems:   HIV disease   Exudative pharyngitis   Polyarthritis    David Hoffman is a 24  y.o. 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 IRIS does not seem as likely given CD4 was high prior to starting therapy  #3 HIV: continue TWO study drugs that he has brought with him from home     LOS: 1 day   Acey Lav 01/20/2015,  11:53 AM

## 2015-01-20 NOTE — Progress Notes (Signed)
[facility] PROGRESS NOTE  David Hoffman MOQ:[id number] DOB: 1991-01-02 DOA: 01/19/2015 PCP: No PCP Per Patient  Assessment/Plan:  Disseminated Gonococcal infection -with fevers, arthralgias, mild headaches -continue ceftriaxone, received 1 dose of azithromycin 6/22 per ID recs -Continue IVF, ID Dr.[place] to see  Exudative pharyngitis  -due to above, positive for N.Gonorhea  -rocephin, add lozenges  HIV disease -Diagnosed in March 2016 -CD4 490 and viral load of 46k in 3/16 -Followed by [facility], currently on a study drug which is continued  DVT proph: lovenox  Code Status: Full Code Family Communication: none at bedside Disposition Plan: home when stable per ID   Consultants:  ID  HPI/Subjective: Complains of joint pains  Objective: Filed Vitals:   01/20/15 0700  BP: 113/50  Pulse: 87  Temp: 100.7 F (38.2 C)  Resp: 18    Intake/Output Summary (Last 24 hours) at 01/20/15 0926 Last data filed at 01/19/15 1700  Gross per 24 hour  Intake    240 ml  Output      0 ml  Net    240 ml   Filed Weights   01/19/15 1456  Weight: 62.596 kg (138 lb)    Exam:   General:  AAOx3, thinly built, no distress  HEENT: pharyngeal erythema, mild exudate noted too  Cardiovascular: S1S2/RRR  Respiratory: CTAB  Abdomen: soft, NT, BS present  Musculoskeletal: mild painful RoM of both ankles, shoulders, wrists, slightly tender   Data Reviewed: Basic Metabolic Panel:  Recent Labs Lab 01/19/15 1610  NA 134*  K 4.1  CL 100*  CO2 26  GLUCOSE 139*  BUN 15  CREATININE 0.96  CALCIUM 8.9   Liver Function Tests:  Recent Labs Lab 01/19/15 1610  AST 28  ALT 29  ALKPHOS 62  BILITOT 0.7  PROT 7.9  ALBUMIN 3.8   No results for input(s): LIPASE, AMYLASE in the last 168 hours. No results for input(s): AMMONIA in the last 168 hours. CBC:  Recent Labs Lab 01/19/15 1610  WBC 15.8*  NEUTROABS 12.8*  HGB 14.2  HCT 41.6  MCV 84.0  PLT 299   Cardiac  Enzymes: No results for input(s): CKTOTAL, CKMB, CKMBINDEX, TROPONINI in the last 168 hours. BNP (last 3 results) No results for input(s): BNP in the last 8760 hours.  ProBNP (last 3 results) No results for input(s): PROBNP in the last 8760 hours.  CBG: No results for input(s): GLUCAP in the last 168 hours.  Recent Results (from the past 240 hour(s))  Rapid strep screen   (If patient has fever and/or without cough or runny nose)     Status: None   Collection Time: 01/17/15  3:44 PM  Result Value Ref Range Status   Streptococcus, Group A Screen (Direct) NEGATIVE NEGATIVE Final    Comment: (NOTE) A Rapid Antigen test may result negative if the antigen level in the sample is below the detection level of this test. The FDA has not cleared this test as a stand-alone test therefore the rapid antigen negative result has reflexed to a Group A Strep culture.   Culture, Group A Strep     Status: Abnormal   Collection Time: 01/17/15  3:44 PM  Result Value Ref Range Status   Strep A Culture Comment (A)  Final    Comment: (NOTE) Beta-hemolytic colonies, not group A Streptococcus isolated. Performed At: [facility] [address] [place], [place] [id number] Mila Homer MD XN:[id number]      Studies: Ct Abdomen Pelvis W Contrast  01/19/2015   CLINICAL DATA:  Abdominal pain. Disseminated gonococcal infection. HIV disease.  EXAM: CT ABDOMEN AND PELVIS WITH CONTRAST  TECHNIQUE: Multidetector CT imaging of the abdomen and pelvis was performed using the standard protocol following bolus administration of intravenous contrast.  CONTRAST:  1 OMNIPAQUE IOHEXOL 300 MG/ML SOLN, OMNIPAQUE IOHEXOL 300 MG/ML SOLN  COMPARISON:  None.  FINDINGS: Lower chest:  Normal.  Hepatobiliary: Normal.  Pancreas: Normal.  Spleen: Normal.  Adrenals/Urinary Tract: Normal except for 4 mm cyst in the mid right kidney.  Stomach/Bowel: Normal.  Vascular/Lymphatic: Normal.  Reproductive: Normal.  Other: No  free air or free fluid.  Musculoskeletal: Prominent central disc protrusion at L4-5. Osseous structures otherwise appear normal.  IMPRESSION: No acute abdominal or pelvic abnormality. Prominent central disc protrusion at L4-5.   Electronically Signed   By: Francene Boyers M.D.   On: 01/19/2015 20:37    Scheduled Meds: . cefTRIAXone (ROCEPHIN)  IV  2 g Intravenous Q24H  . enoxaparin (LOVENOX) injection  40 mg Subcutaneous Q24H  . Abacavir-Dolutegravir-Lamivud  2 tablet Oral Daily   Continuous Infusions: . sodium chloride     Antibiotics Given (last 72 hours)    Date/Time Action Medication Dose Rate   01/19/15 1622 Given   cefTRIAXone (ROCEPHIN) 2 g in dextrose 5 % 50 mL IVPB - Premix 2 g 100 mL/hr   01/19/15 1623 Given   azithromycin (ZITHROMAX) tablet 1,000 mg 1,000 mg    01/19/15 1758 Given   Study Medication 2 tablet       Principal Problem:   DGI (disseminated gonococcal infection) Active Problems:   HIV disease   Exudative pharyngitis   Polyarthritis    Time spent:    [facility]  [facility] Pager [phone]. If 7PM-7AM, please contact night-coverage at [email or web address], password [facility] 01/20/2015, 9:26 AM  LOS: 1 day

## 2015-01-21 LAB — CBC
HEMATOCRIT: 41.8 % (ref 39.0–52.0)
Hemoglobin: 14.1 g/dL (ref 13.0–17.0)
MCH: 28.5 pg (ref 26.0–34.0)
MCHC: 33.7 g/dL (ref 30.0–36.0)
MCV: 84.4 fL (ref 78.0–100.0)
PLATELETS: 296 10*3/uL (ref 150–400)
RBC: 4.95 MIL/uL (ref 4.22–5.81)
RDW: 12.4 % (ref 11.5–15.5)
WBC: 9.2 10*3/uL (ref 4.0–10.5)

## 2015-01-21 LAB — EPSTEIN-BARR VIRUS VCA ANTIBODY PANEL
EBV NA IgG: 478 U/mL — ABNORMAL HIGH (ref 0.0–17.9)
EBV VCA IGG: 358 U/mL — AB (ref 0.0–17.9)
EBV VCA IgM: <36 U/mL (ref 0.0–35.9)

## 2015-01-21 LAB — BASIC METABOLIC PANEL
Anion gap: 11 (ref 5–15)
BUN: 10 mg/dL (ref 6–20)
CHLORIDE: 101 mmol/L (ref 101–111)
CO2: 25 mmol/L (ref 22–32)
Calcium: 8.9 mg/dL (ref 8.9–10.3)
Creatinine, Ser: 0.99 mg/dL (ref 0.61–1.24)
GFR calc Af Amer: >60 mL/min (ref >60)
GFR calc non Af Amer: >60 mL/min (ref >60)
GLUCOSE: 85 mg/dL (ref 65–99)
POTASSIUM: 4 mmol/L (ref 3.5–5.1)
Sodium: 137 mmol/L (ref 135–145)

## 2015-01-21 LAB — RPR: RPR: NONREACTIVE

## 2015-01-21 LAB — CMV IGM: CMV IgM: <30 AU/mL (ref 0.0–29.9)

## 2015-01-21 LAB — CMV ANTIBODY, IGG (EIA): CMV Ab - IgG: >10 U/mL — ABNORMAL HIGH (ref 0.00–0.59)

## 2015-01-21 MED ORDER — MORPHINE SULFATE 2 MG/ML IJ SOLN
2.0000 mg | Freq: Once | INTRAMUSCULAR | Status: AC
  Administered 2015-01-22: 2 mg via INTRAVENOUS
  Filled 2015-01-21: qty 1

## 2015-01-21 MED ORDER — LORAZEPAM 2 MG/ML IJ SOLN
1.0000 mg | Freq: Once | INTRAMUSCULAR | Status: AC
  Administered 2015-01-22: 1 mg via INTRAVENOUS
  Filled 2015-01-21: qty 1

## 2015-01-21 MED ORDER — STUDY - INVESTIGATIONAL DRUG SIMPLE RECORD: Status: DC

## 2015-01-21 NOTE — Progress Notes (Signed)
Pt. Temp 102.1, Dr Craige Cotta notified ordered a bolus. Fluid given as ordered.

## 2015-01-21 NOTE — Progress Notes (Signed)
[facility]    Subjective: Still with fever in last 24 hours, with signfiicant exudate on OP and diffuse arthritic pains when not getting ultram   Antibiotics:  Anti-infectives    Start     Dose/Rate Route Frequency Ordered Stop   01/19/15 1800  Study Medication     2 tablet Oral Daily 01/19/15 1519     01/19/15 1600  cefTRIAXone (ROCEPHIN) 2 g in dextrose 5 % 50 mL IVPB - Premix     2 g 100 mL/hr over 30 Minutes Intravenous Every 24 hours 01/19/15 1541     01/19/15 1600  azithromycin (ZITHROMAX) tablet 1,000 mg  Status:  Discontinued     1,000 mg Oral Daily 01/19/15 1541 01/20/15 0920      Medications: Scheduled Meds: . cefTRIAXone (ROCEPHIN)  IV  2 g Intravenous Q24H  . enoxaparin (LOVENOX) injection  40 mg Subcutaneous Q24H  . Abacavir-Dolutegravir-Lamivud  2 tablet Oral Daily   Continuous Infusions: . sodium chloride 75 mL/hr at 01/21/15 1106   PRN Meds:.acetaminophen **OR** acetaminophen, HYDROcodone-acetaminophen, ketorolac, lidocaine, magic mouthwash w/lidocaine, menthol-cetylpyridinium, ondansetron **OR** ondansetron (ZOFRAN) IV    Objective: Weight change:   Intake/Output Summary (Last 24 hours) at 01/21/15 1543 Last data filed at 01/21/15 0904  Gross per 24 hour  Intake   1245 ml  Output      0 ml  Net   1245 ml   Blood pressure 126/56, pulse 67, temperature 100.2 F (37.9 C), temperature source Oral, resp. rate 18, height 5' 6.5" (1.689 m), weight 138 lb (62.596 kg), SpO2 100 %. Temp:  [98.4 F (36.9 C)-102.1 F (38.9 C)] 100.2 F (37.9 C) (06/24 1346) Pulse Rate:  [61-90] 67 (06/24 1346) Resp:  [16-18] 18 (06/24 1346) BP: (112-126)/(53-74) 126/56 mmHg (06/24 1346) SpO2:  [100 %] 100 % (06/24 1346)  Physical Exam: General: Alert and awake, oriented x3, not in any acute distress. HEENT: anicteric sclera,  EOMI, inlfamed tonsils oropharyx with exudate CVS regular rate, normal r,  no murmur rubs or  gallops Chest: clear to auscultation bilaterally, no wheezing, rales or rhonchi Abdomen: soft nontender, nondistended, normal bowel sounds, Extremities: elbows, wrists, knees all hurt and are warm  But not tender today after ultram, no  frank effusion Skin: no rashes Lymph: cervical lymphadenopathy Neuro: nonfocal  CBC:  CBC Latest Ref Rng 01/21/2015 01/20/2015 01/19/2015  WBC 4.0 - 10.5 K/uL 9.2 11.2(H) 15.8(H)  Hemoglobin 13.0 - 17.0 g/dL 04.5 40.9 81.1  Hematocrit 39.0 - 52.0 % 41.8 41.6 41.6  Platelets 150 - 400 K/uL 296 312 299       BMET  Recent Labs  01/19/15 1610 01/21/15 0605  NA 134* 137  K 4.1 4.0  CL 100* 101  CO2 26 25  GLUCOSE 139* 85  BUN 15 10  CREATININE 0.96 0.99  CALCIUM 8.9 8.9     Liver Panel   Recent Labs  01/19/15 1610  PROT 7.9  ALBUMIN 3.8  AST 28  ALT 29  ALKPHOS 62  BILITOT 0.7       Sedimentation Rate No results for input(s): ESRSEDRATE in the last 72 hours. C-Reactive Protein No results for input(s): CRP in the last 72 hours.  Micro Results: Recent Results (from the past 720 hour(s))  Rapid strep screen   (If patient has fever and/or without cough or runny nose)     Status: None   Collection Time: 01/17/15  3:44 PM  Result Value Ref Range Status   Streptococcus, Group A Screen (Direct) NEGATIVE NEGATIVE Final    Comment: (NOTE) A Rapid Antigen test may result negative if the antigen level in the sample is below the detection level of this test. The FDA has not cleared this test as a stand-alone test therefore the rapid antigen negative result has reflexed to a Group A Strep culture.   Culture, Group A Strep     Status: Abnormal   Collection Time: 01/17/15  3:44 PM  Result Value Ref Range Status   Strep A Culture Comment (A)  Final    Comment: (NOTE) Beta-hemolytic colonies, not group A Streptococcus isolated. Performed At: [facility] [address] [place], [place] [id number] Mila Homer MD  WU:[id number]   Culture, blood (routine x 2)     Status: None (Preliminary result)   Collection Time: 01/19/15  4:10 PM  Result Value Ref Range Status   Specimen Description BLOOD RIGHT ARM  Final   Special Requests BOTTLES DRAWN AEROBIC AND ANAEROBIC 10CC  Final   Culture   Final    NO GROWTH 2 DAYS Performed at [facility]    Report Status PENDING  Incomplete  Culture, blood (routine x 2)     Status: None (Preliminary result)   Collection Time: 01/19/15  4:10 PM  Result Value Ref Range Status   Specimen Description BLOOD LEFT ARM  Final   Special Requests BOTTLES DRAWN AEROBIC AND ANAEROBIC 10CC  Final   Culture   Final    NO GROWTH 2 DAYS Performed at [facility]    Report Status PENDING  Incomplete    Studies/Results: Ct Abdomen Pelvis W Contrast  01/19/2015   CLINICAL DATA:  Abdominal pain. Disseminated gonococcal infection. HIV disease.  EXAM: CT ABDOMEN AND PELVIS WITH CONTRAST  TECHNIQUE: Multidetector CT imaging of the abdomen and pelvis was performed using the standard protocol following bolus administration of intravenous contrast.  CONTRAST:  1 OMNIPAQUE IOHEXOL 300 MG/ML SOLN, OMNIPAQUE IOHEXOL 300 MG/ML SOLN  COMPARISON:  None.  FINDINGS: Lower chest:  Normal.  Hepatobiliary: Normal.  Pancreas: Normal.  Spleen: Normal.  Adrenals/Urinary Tract: Normal except for 4 mm cyst in the mid right kidney.  Stomach/Bowel: Normal.  Vascular/Lymphatic: Normal.  Reproductive: Normal.  Other: No free air or free fluid.  Musculoskeletal: Prominent central disc protrusion at L4-5. Osseous structures otherwise appear normal.  IMPRESSION: No acute abdominal or pelvic abnormality. Prominent central disc protrusion at L4-5.   Electronically Signed   By: Francene Boyers M.D.   On: 01/19/2015 20:37      Assessment/Plan:  Principal Problem:   DGI (disseminated gonococcal infection) Active Problems:   HIV disease   Exudative pharyngitis   Polyarthritis   Cervical  lymphadenitis   Headache above the eye region    [employer] is a 24 y.o. 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  #1 DGI:  --continue rocephin IV daily and he has had azithromycin 1g x1, some individuals may require 7-14 days of therapy to effect cure  I DO NOT think he is in any shape YET for step down to oral cefixime eitehr  Would continue ROCEPHIN  If still with exudate on Sunday would send for GC culture  ---viscous lidocaine for pain relief --test for syphilis as well  #2 HIV: continue TWO study drugs that he has brought with him from home  NOTE HIS MOTHER AND NONE OF HIS FAMILY  MEMBERS KNOW OF THIS DIAGNOSIS SO PLEASE DO NOT SPEAK OF THIS WHEN ANYONE IN THE ROOM OTHER THAN THE PATIENT  Dr Luciana Axe covering for me this weekend.    LOS: 2 days   Acey Lav 01/21/2015, 3:43 PM

## 2015-01-21 NOTE — Progress Notes (Addendum)
[facility] PROGRESS NOTE  David Hoffman GBT:[id number] DOB: 1991/03/17 DOA: 01/19/2015 PCP: No PCP Per Patient  Assessment/Plan:  Disseminated Gonococcal infection -with fevers, arthralgias, mild headaches -improved, temp of 102 last pm -continue ceftriaxone, received 1 dose of azithromycin 6/22 per ID recs -ID Dr.Van Dam following, blood Cx negative -DC home when cleared by ID  Exudative pharyngitis  -due to above, positive for N.Gonorhea  -rocephin,  Lozenges, supportive care  HIV disease -Diagnosed in March 2016 -CD4 490 and viral load of 46k in 3/16 -Followed by [facility], currently on a study drug which is continued  Positive CMV serology: -IgG positive but IgM negative, per ID  DVT proph: lovenox  Code Status: Full Code Family Communication: none at bedside Disposition Plan: home today if cleared by ID   Consultants:  ID  HPI/Subjective: C/o sore throat, no further joint pain, temp last night  Objective: Filed Vitals:   01/21/15 0634  BP: 112/53  Pulse: 61  Temp: 98.4 F (36.9 C)  Resp: 16    Intake/Output Summary (Last 24 hours) at 01/21/15 1112 Last data filed at 01/21/15 0904  Gross per 24 hour  Intake   1485 ml  Output      0 ml  Net   1485 ml   Filed Weights   01/19/15 1456  Weight: 62.596 kg (138 lb)    Exam:   General:  AAOx3, thinly built, no distress  HEENT: pharyngeal erythema, mild exudate noted too  Cardiovascular: S1S2/RRR  Respiratory: CTAB  Abdomen: soft, NT, BS present  Musculoskeletal: no more tenderness of ankles, shoulders, wrists,   Data Reviewed: Basic Metabolic Panel:  Recent Labs Lab 01/19/15 1610 01/21/15 0605  NA 134* 137  K 4.1 4.0  CL 100* 101  CO2 26 25  GLUCOSE 139* 85  BUN 15 10  CREATININE 0.96 0.99  CALCIUM 8.9 8.9   Liver Function Tests:  Recent Labs Lab 01/19/15 1610  AST 28  ALT 29  ALKPHOS 62  BILITOT 0.7  PROT 7.9  ALBUMIN 3.8   No results for input(s): LIPASE,  AMYLASE in the last 168 hours. No results for input(s): AMMONIA in the last 168 hours. CBC:  Recent Labs Lab 01/19/15 1610 01/20/15 1153 01/21/15 0605  WBC 15.8* 11.2* 9.2  NEUTROABS 12.8* 7.2  --   HGB 14.2 13.8 14.1  HCT 41.6 41.6 41.8  MCV 84.0 84.9 84.4  PLT 299 312 296   Cardiac Enzymes: No results for input(s): CKTOTAL, CKMB, CKMBINDEX, TROPONINI in the last 168 hours. BNP (last 3 results) No results for input(s): BNP in the last 8760 hours.  ProBNP (last 3 results) No results for input(s): PROBNP in the last 8760 hours.  CBG: No results for input(s): GLUCAP in the last 168 hours.  Recent Results (from the past 240 hour(s))  Rapid strep screen   (If patient has fever and/or without cough or runny nose)     Status: None   Collection Time: 01/17/15  3:44 PM  Result Value Ref Range Status   Streptococcus, Group A Screen (Direct) NEGATIVE NEGATIVE Final    Comment: (NOTE) A Rapid Antigen test may result negative if the antigen level in the sample is below the detection level of this test. The FDA has not cleared this test as a stand-alone test therefore the rapid antigen negative result has reflexed to a Group A Strep culture.   Culture, Group A Strep     Status: Abnormal   Collection Time: 01/17/15  3:44 PM  Result Value Ref Range Status   Strep A Culture Comment (A)  Final    Comment: (NOTE) Beta-hemolytic colonies, not group A Streptococcus isolated. Performed At: [facility] [address] [place] [address] [place], [place] [id number] Mila Homer MD BJ:[id number]   Culture, blood (routine x 2)     Status: None (Preliminary result)   Collection Time: 01/19/15  4:10 PM  Result Value Ref Range Status   Specimen Description BLOOD RIGHT ARM  Final   Special Requests BOTTLES DRAWN AEROBIC AND ANAEROBIC 10CC  Final   Culture   Final    NO GROWTH < 24 HOURS Performed at [facility]    Report Status PENDING  Incomplete  Culture, blood (routine x 2)      Status: None (Preliminary result)   Collection Time: 01/19/15  4:10 PM  Result Value Ref Range Status   Specimen Description BLOOD LEFT ARM  Final   Special Requests BOTTLES DRAWN AEROBIC AND ANAEROBIC 10CC  Final   Culture   Final    NO GROWTH < 24 HOURS Performed at [facility]    Report Status PENDING  Incomplete     Studies: Ct Abdomen Pelvis W Contrast  01/19/2015   CLINICAL DATA:  Abdominal pain. Disseminated gonococcal infection. HIV disease.  EXAM: CT ABDOMEN AND PELVIS WITH CONTRAST  TECHNIQUE: Multidetector CT imaging of the abdomen and pelvis was performed using the standard protocol following bolus administration of intravenous contrast.  CONTRAST:  1 OMNIPAQUE IOHEXOL 300 MG/ML SOLN, OMNIPAQUE IOHEXOL 300 MG/ML SOLN  COMPARISON:  None.  FINDINGS: Lower chest:  Normal.  Hepatobiliary: Normal.  Pancreas: Normal.  Spleen: Normal.  Adrenals/Urinary Tract: Normal except for 4 mm cyst in the mid right kidney.  Stomach/Bowel: Normal.  Vascular/Lymphatic: Normal.  Reproductive: Normal.  Other: No free air or free fluid.  Musculoskeletal: Prominent central disc protrusion at L4-5. Osseous structures otherwise appear normal.  IMPRESSION: No acute abdominal or pelvic abnormality. Prominent central disc protrusion at L4-5.   Electronically Signed   By: Francene Boyers M.D.   On: 01/19/2015 20:37    Scheduled Meds: . cefTRIAXone (ROCEPHIN)  IV  2 g Intravenous Q24H  . enoxaparin (LOVENOX) injection  40 mg Subcutaneous Q24H  . Abacavir-Dolutegravir-Lamivud  2 tablet Oral Daily   Continuous Infusions: . sodium chloride 75 mL/hr at 01/21/15 1106   Antibiotics Given (last 72 hours)    Date/Time Action Medication Dose Rate   01/19/15 1622 Given   cefTRIAXone (ROCEPHIN) 2 g in dextrose 5 % 50 mL IVPB - Premix 2 g 100 mL/hr   01/19/15 1623 Given   azithromycin (ZITHROMAX) tablet 1,000 mg 1,000 mg    01/19/15 1758 Given   Study Medication 2 tablet    01/20/15 1624 Given    cefTRIAXone (ROCEPHIN) 2 g in dextrose 5 % 50 mL IVPB - Premix 2 g 100 mL/hr   01/20/15 1754 Given   Study Medication 2 tablet       Principal Problem:   DGI (disseminated gonococcal infection) Active Problems:   HIV disease   Exudative pharyngitis   Polyarthritis   Cervical lymphadenitis   Headache above the eye region    Time spent:    [facility]  [facility] Pager [phone]. If 7PM-7AM, please contact night-coverage at [email or web address], password [facility] 01/21/2015, 11:12 AM  LOS: 2 days

## 2015-01-21 NOTE — Progress Notes (Signed)
Contacted pt work, [employer], [phone] per pt request to let them know pt is in hospital.  [facility], HR, reports she needs documentation faxed to her regarding pt admission and an estimated RTW date.

## 2015-01-22 DIAGNOSIS — B20 Human immunodeficiency virus [HIV] disease: Secondary | ICD-10-CM

## 2015-01-22 DIAGNOSIS — A5486 Gonococcal sepsis: Secondary | ICD-10-CM

## 2015-01-22 DIAGNOSIS — J029 Acute pharyngitis, unspecified: Secondary | ICD-10-CM

## 2015-01-22 MED ORDER — ALPRAZOLAM 0.25 MG PO TABS
0.2500 mg | ORAL_TABLET | Freq: Three times a day (TID) | ORAL | Status: DC | PRN
  Administered 2015-01-22 – 2015-01-23 (×2): 0.25 mg via ORAL
  Filled 2015-01-22 (×2): qty 1

## 2015-01-22 NOTE — Progress Notes (Signed)
PATIENT DETAILS Name: David Hoffman Age: 24 y.o. Sex: male Date of Birth: 1990-11-08 Admit Date: 01/19/2015 Admitting Physician No admitting provider for patient encounter. PCP:No PCP Per Patient  Subjective: Complains of sore throat. Joint pain much better.  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 Started on Rocephin and received 1 dose of Zithromax. Improving, now afebrile for the past 24 hours. Continue Rocephin. Blood cultures negative so far.  Active Problems: Exudative pharyngitis: Secondary to above. Continue with Rocephin and other supportive measures.  HIV: Followed by infectious disease, currently on a study drug which is continued. Last CD4 of 490.  Disposition: Remain inpatient-Home in 1-2 days   Antimicrobial agents  See below  Anti-infectives    Start     Dose/Rate Route Frequency Ordered Stop   01/19/15 1800  Study Medication     2 tablet Oral Daily 01/19/15 1519     01/19/15 1600  cefTRIAXone (ROCEPHIN) 2 g in dextrose 5 % 50 mL IVPB - Premix     2 g 100 mL/hr over 30 Minutes Intravenous Every 24 hours 01/19/15 1541     01/19/15 1600  azithromycin (ZITHROMAX) tablet 1,000 mg  Status:  Discontinued     1,000 mg Oral Daily 01/19/15 1541 01/20/15 0920      DVT Prophylaxis: Prophylactic Lovenox   Code Status: Full code   Family Communication None at bedside  Procedures: None  CONSULTS:  ID  Time spent 25 minutes-Greater than 50% of this time was spent in counseling, explanation of diagnosis, planning of further management, and coordination of care.  MEDICATIONS: Scheduled Meds: . cefTRIAXone (ROCEPHIN)  IV  2 g Intravenous Q24H  . enoxaparin (LOVENOX) injection  40 mg Subcutaneous Q24H  . Abacavir-Dolutegravir-Lamivud  2 tablet Oral Daily   Continuous Infusions: . sodium chloride 75 mL/hr at 01/22/15 1358   PRN Meds:.acetaminophen  **OR** acetaminophen, HYDROcodone-acetaminophen, ketorolac, lidocaine, magic mouthwash w/lidocaine, menthol-cetylpyridinium, ondansetron **OR** ondansetron (ZOFRAN) IV    PHYSICAL EXAM: Vital signs in last 24 hours: Filed Vitals:   01/21/15 1346 01/21/15 2019 01/21/15 2233 01/22/15 0448  BP: 126/56 136/66 127/70 126/68  Pulse: 67 68 71 70  Temp: 100.2 F (37.9 C) 99.1 F (37.3 C) 99.4 F (37.4 C) 99.8 F (37.7 C)  TempSrc: Oral Oral Oral Oral  Resp: Height:      Weight:      SpO2: 100% 100% 100% 100%    Weight change:  Filed Weights   01/19/15 1456  Weight: 62.596 kg (138 lb)   Body mass index is 21.94 kg/(m^2).   Gen Exam: Awake and alert with clear speech.  Throat: Bilateral tonsillar enlargement with white exudates  Neck: Supple, No JVD.   Chest: B/L Clear.   CVS: S1 S2 Regular, no murmurs.  Abdomen: soft, BS +, non tender, non distended.  Extremities: no edema, lower extremities warm to touch. Neurologic: Non Focal.   Skin: No Rash.   Wounds: N/A.   Intake/Output from previous day:  Intake/Output Summary (Last 24 hours) at 01/22/15 1456 Last data filed at 01/21/15 1700  Gross per 24 hour  Intake    100 ml  Output      0 ml  Net    100 ml     LAB RESULTS: CBC  Recent Labs Lab 01/19/15 1610 01/20/15 1153 01/21/15 0605  WBC 15.8* 11.2* 9.2  HGB  14.2 13.8 14.1  HCT 41.6 41.6 41.8  PLT 299 312 296  MCV 84.0 84.9 84.4  MCH 28.7 28.2 28.5  MCHC 34.1 33.2 33.7  RDW 12.4 12.6 12.4  LYMPHSABS 1.5 2.1  --   MONOABS 1.4* 1.9*  --   EOSABS 0.0 0.0  --   BASOSABS 0.0 0.0  --     Chemistries   Recent Labs Lab 01/19/15 1610 01/21/15 0605  NA 134* 137  K 4.1 4.0  CL 100* 101  CO2 26 25  GLUCOSE 139* 85  BUN 15 10  CREATININE 0.96 0.99  CALCIUM 8.9 8.9    CBG: No results for input(s): GLUCAP in the last 168 hours.  GFR Estimated Creatinine Clearance: 103.6 mL/min (by C-G formula based on Cr of 0.99).  Coagulation  profile No results for input(s): INR, PROTIME in the last 168 hours.  Cardiac Enzymes No results for input(s): CKMB, TROPONINI, MYOGLOBIN in the last 168 hours.  Invalid input(s): CK  Invalid input(s): POCBNP No results for input(s): DDIMER in the last 72 hours. No results for input(s): HGBA1C in the last 72 hours. No results for input(s): CHOL, HDL, LDLCALC, TRIG, CHOLHDL, LDLDIRECT in the last 72 hours. No results for input(s): TSH, T4TOTAL, T3FREE, THYROIDAB in the last 72 hours.  Invalid input(s): FREET3 No results for input(s): VITAMINB12, FOLATE, FERRITIN, TIBC, IRON, RETICCTPCT in the last 72 hours. No results for input(s): LIPASE, AMYLASE in the last 72 hours.  Urine Studies No results for input(s): UHGB, CRYS in the last 72 hours.  Invalid input(s): UACOL, UAPR, USPG, UPH, UTP, UGL, UKET, UBIL, UNIT, UROB, ULEU, UEPI, UWBC, URBC, UBAC, CAST, UCOM, BILUA  MICROBIOLOGY: Recent Results (from the past 240 hour(s))  Rapid strep screen   (If patient has fever and/or without cough or runny nose)     Status: None   Collection Time: 01/17/15  3:44 PM  Result Value Ref Range Status   Streptococcus, Group A Screen (Direct) NEGATIVE NEGATIVE Final    Comment: (NOTE) A Rapid Antigen test may result negative if the antigen level in the sample is below the detection level of this test. The FDA has not cleared this test as a stand-alone test therefore the rapid antigen negative result has reflexed to a Group A Strep culture.   Culture, Group A Strep     Status: Abnormal   Collection Time: 01/17/15  3:44 PM  Result Value Ref Range Status   Strep A Culture Comment (A)  Final    Comment: (NOTE) Beta-hemolytic colonies, not group A Streptococcus isolated. Performed At: [facility] [address] [place], [place] [id number] Mila Homer MD PT:[id number]   Culture, blood (routine x 2)     Status: None (Preliminary result)   Collection Time: 01/19/15  4:10 PM  Result  Value Ref Range Status   Specimen Description BLOOD RIGHT ARM  Final   Special Requests BOTTLES DRAWN AEROBIC AND ANAEROBIC 10CC  Final   Culture   Final    NO GROWTH 3 DAYS Performed at [facility]    Report Status PENDING  Incomplete  Culture, blood (routine x 2)     Status: None (Preliminary result)   Collection Time: 01/19/15  4:10 PM  Result Value Ref Range Status   Specimen Description BLOOD LEFT ARM  Final   Special Requests BOTTLES DRAWN AEROBIC AND ANAEROBIC 10CC  Final   Culture   Final    NO GROWTH 3 DAYS Performed at [facility]  Report Status PENDING  Incomplete    RADIOLOGY STUDIES/RESULTS: Ct Abdomen Pelvis W Contrast  01/19/2015   CLINICAL DATA:  Abdominal pain. Disseminated gonococcal infection. HIV disease.  EXAM: CT ABDOMEN AND PELVIS WITH CONTRAST  TECHNIQUE: Multidetector CT imaging of the abdomen and pelvis was performed using the standard protocol following bolus administration of intravenous contrast.  CONTRAST:  1 OMNIPAQUE IOHEXOL 300 MG/ML SOLN, OMNIPAQUE IOHEXOL 300 MG/ML SOLN  COMPARISON:  None.  FINDINGS: Lower chest:  Normal.  Hepatobiliary: Normal.  Pancreas: Normal.  Spleen: Normal.  Adrenals/Urinary Tract: Normal except for 4 mm cyst in the mid right kidney.  Stomach/Bowel: Normal.  Vascular/Lymphatic: Normal.  Reproductive: Normal.  Other: No free air or free fluid.  Musculoskeletal: Prominent central disc protrusion at L4-5. Osseous structures otherwise appear normal.  IMPRESSION: No acute abdominal or pelvic abnormality. Prominent central disc protrusion at L4-5.   Electronically Signed   By: Francene Boyers M.D.   On: 01/19/2015 20:37   Dg Bone Density  01/04/2015   : The examination was performed at the [facility]. Interpretation will be provided by the [facility] DXA study physician.   Electronically Signed   By: Amie Portland M.D.   On: 01/04/2015 14:16    Jeoffrey Massed, MD  [facility] Pager:[phone]  If 7PM-7AM, please contact night-coverage [email or web address] Password [email or web address] 01/22/2015, 2:56 PM   LOS: 3 days

## 2015-01-23 NOTE — Progress Notes (Signed)
PATIENT DETAILS Name: David Hoffman Age: 24 y.o. Sex: male Date of Birth: 08-19-1990 Admit Date: 01/19/2015 Admitting Physician No admitting provider for patient encounter. 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 Started on Rocephin and received 1 dose of Zithromax. Improved,arthralgias have resolved. Afebrile for the past 48 hours. Continue Rocephin. Blood cultures negative so far.  Active Problems: Exudative pharyngitis: Secondary to above. Continue with Rocephin and other supportive measures.  HIV: Followed by infectious disease, currently on a study drug which is continued. Last CD4 of 490.  Disposition: Remain inpatient-Home 6/27  Antimicrobial agents  See below  Anti-infectives    Start     Dose/Rate Route Frequency Ordered Stop   01/19/15 1800  Study Medication     2 tablet Oral Daily 01/19/15 1519     01/19/15 1600  cefTRIAXone (ROCEPHIN) 2 g in dextrose 5 % 50 mL IVPB - Premix     2 g 100 mL/hr over 30 Minutes Intravenous Every 24 hours 01/19/15 1541     01/19/15 1600  azithromycin (ZITHROMAX) tablet 1,000 mg  Status:  Discontinued     1,000 mg Oral Daily 01/19/15 1541 01/20/15 0920      DVT Prophylaxis: Prophylactic Lovenox   Code Status: Full code   Family Communication None at bedside  Procedures: None  CONSULTS:  ID  Time spent 25 minutes-Greater than 50% of this time was spent in counseling, explanation of diagnosis, planning of further management, and coordination of care.  MEDICATIONS: Scheduled Meds: . cefTRIAXone (ROCEPHIN)  IV  2 g Intravenous Q24H  . enoxaparin (LOVENOX) injection  40 mg Subcutaneous Q24H  . Abacavir-Dolutegravir-Lamivud  2 tablet Oral Daily   Continuous Infusions:   PRN  Meds:.acetaminophen **OR** acetaminophen, ALPRAZolam, HYDROcodone-acetaminophen, ketorolac, lidocaine, magic mouthwash w/lidocaine, menthol-cetylpyridinium, ondansetron **OR** ondansetron (ZOFRAN) IV    PHYSICAL EXAM: Vital signs in last 24 hours: Filed Vitals:   01/22/15 0448 01/22/15 1514 01/22/15 2115 01/23/15 0605  BP: 126/68 121/65 104/87 118/67  Pulse: 70 78 65 66  Temp: 99.8 F (37.7 C) 98.9 F (37.2 C) 98.2 F (36.8 C) 98.2 F (36.8 C)  TempSrc: Oral Oral Oral Oral  Resp: Height:      Weight:      SpO2: 100% 100% 100% 99%    Weight change:  Filed Weights   01/19/15 1456  Weight: 62.596 kg (138 lb)   Body mass index is 21.94 kg/(m^2).   Gen Exam: Awake and alert with clear speech.  Throat: Bilateral tonsillar enlargement with white exudates  Neck: Supple, No JVD.   Chest: B/L Clear.   CVS: S1 S2 Regular, no murmurs.  Abdomen: soft, BS +, non tender, non distended.  Extremities: no edema, lower extremities warm to touch. Neurologic: Non Focal.   Skin: No Rash.   Wounds: N/A.   Intake/Output from previous day: No intake or output data in the 24 hours ending 01/23/15 1144   LAB RESULTS: CBC  Recent Labs Lab 01/19/15 1610 01/20/15 1153 01/21/15 0605  WBC 15.8* 11.2* 9.2  HGB 14.2 13.8 14.1  HCT 41.6 41.6 41.8  PLT 299 312 296  MCV 84.0 84.9 84.4  MCH 28.7 28.2 28.5  MCHC 34.1 33.2 33.7  RDW 12.4 12.6 12.4  LYMPHSABS 1.5 2.1  --  MONOABS 1.4* 1.9*  --   EOSABS 0.0 0.0  --   BASOSABS 0.0 0.0  --     Chemistries   Recent Labs Lab 01/19/15 1610 01/21/15 0605  NA 134* 137  K 4.1 4.0  CL 100* 101  CO2 26 25  GLUCOSE 139* 85  BUN 15 10  CREATININE 0.96 0.99  CALCIUM 8.9 8.9    CBG: No results for input(s): GLUCAP in the last 168 hours.  GFR Estimated Creatinine Clearance: 103.6 mL/min (by C-G formula based on Cr of 0.99).  Coagulation profile No results for input(s): INR, PROTIME in the last 168 hours.  Cardiac  Enzymes No results for input(s): CKMB, TROPONINI, MYOGLOBIN in the last 168 hours.  Invalid input(s): CK  Invalid input(s): POCBNP No results for input(s): DDIMER in the last 72 hours. No results for input(s): HGBA1C in the last 72 hours. No results for input(s): CHOL, HDL, LDLCALC, TRIG, CHOLHDL, LDLDIRECT in the last 72 hours. No results for input(s): TSH, T4TOTAL, T3FREE, THYROIDAB in the last 72 hours.  Invalid input(s): FREET3 No results for input(s): VITAMINB12, FOLATE, FERRITIN, TIBC, IRON, RETICCTPCT in the last 72 hours. No results for input(s): LIPASE, AMYLASE in the last 72 hours.  Urine Studies No results for input(s): UHGB, CRYS in the last 72 hours.  Invalid input(s): UACOL, UAPR, USPG, UPH, UTP, UGL, UKET, UBIL, UNIT, UROB, ULEU, UEPI, UWBC, URBC, UBAC, CAST, UCOM, BILUA  MICROBIOLOGY: Recent Results (from the past 240 hour(s))  Rapid strep screen   (If patient has fever and/or without cough or runny nose)     Status: None   Collection Time: 01/17/15  3:44 PM  Result Value Ref Range Status   Streptococcus, Group A Screen (Direct) NEGATIVE NEGATIVE Final    Comment: (NOTE) A Rapid Antigen test may result negative if the antigen level in the sample is below the detection level of this test. The FDA has not cleared this test as a stand-alone test therefore the rapid antigen negative result has reflexed to a Group A Strep culture.   Culture, Group A Strep     Status: Abnormal   Collection Time: 01/17/15  3:44 PM  Result Value Ref Range Status   Strep A Culture Comment (A)  Final    Comment: (NOTE) Beta-hemolytic colonies, not group A Streptococcus isolated. Performed At: [facility] [address] [place], [place] [id number] Mila Homer MD WU:[id number]   Culture, blood (routine x 2)     Status: None (Preliminary result)   Collection Time: 01/19/15  4:10 PM  Result Value Ref Range Status   Specimen Description BLOOD RIGHT ARM  Final   Special  Requests BOTTLES DRAWN AEROBIC AND ANAEROBIC 10CC  Final   Culture   Final    NO GROWTH 3 DAYS Performed at [facility]    Report Status PENDING  Incomplete  Culture, blood (routine x 2)     Status: None (Preliminary result)   Collection Time: 01/19/15  4:10 PM  Result Value Ref Range Status   Specimen Description BLOOD LEFT ARM  Final   Special Requests BOTTLES DRAWN AEROBIC AND ANAEROBIC 10CC  Final   Culture   Final    NO GROWTH 3 DAYS Performed at [facility]    Report Status PENDING  Incomplete    RADIOLOGY STUDIES/RESULTS: Ct Abdomen Pelvis W Contrast  01/19/2015   CLINICAL DATA:  Abdominal pain. Disseminated gonococcal infection. HIV disease.  EXAM: CT ABDOMEN AND PELVIS WITH CONTRAST  TECHNIQUE: Multidetector CT imaging of the abdomen and pelvis was performed using the standard protocol following bolus administration of intravenous contrast.  CONTRAST:  1 OMNIPAQUE IOHEXOL 300 MG/ML SOLN, OMNIPAQUE IOHEXOL 300 MG/ML SOLN  COMPARISON:  None.  FINDINGS: Lower chest:  Normal.  Hepatobiliary: Normal.  Pancreas: Normal.  Spleen: Normal.  Adrenals/Urinary Tract: Normal except for 4 mm cyst in the mid right kidney.  Stomach/Bowel: Normal.  Vascular/Lymphatic: Normal.  Reproductive: Normal.  Other: No free air or free fluid.  Musculoskeletal: Prominent central disc protrusion at L4-5. Osseous structures otherwise appear normal.  IMPRESSION: No acute abdominal or pelvic abnormality. Prominent central disc protrusion at L4-5.   Electronically Signed   By: Francene Boyers M.D.   On: 01/19/2015 20:37   Dg Bone Density  01/04/2015   : The examination was performed at the [facility]. Interpretation will be provided by the [facility] DXA study physician.   Electronically Signed   By: Amie Portland M.D.   On: 01/04/2015 14:16    Jeoffrey Massed, MD  [facility] Pager:[phone]  If 7PM-7AM, please contact night-coverage [email or web address] Password  [email or web address] 01/23/2015, 11:44 AM   LOS: 4 days

## 2015-01-24 LAB — CULTURE, BLOOD (ROUTINE X 2)
Culture: NO GROWTH
Culture: NO GROWTH

## 2015-01-24 MED ORDER — MAGIC MOUTHWASH W/LIDOCAINE: 5.0000 mL | Freq: Four times a day (QID) | ORAL | Status: DC | PRN

## 2015-01-24 MED ORDER — MENTHOL 3 MG MT LOZG: 1.0000 | LOZENGE | OROMUCOSAL | Status: DC | PRN

## 2015-01-24 MED ORDER — CEFIXIME 400 MG PO CAPS: 400.0000 mg | ORAL_CAPSULE | Freq: Two times a day (BID) | ORAL | Status: DC

## 2015-01-24 NOTE — Discharge Summary (Signed)
PATIENT DETAILS Name: David Hoffman Age: 24 y.o. Sex: male Date of Birth: 06-09-1991 MRN: [id number]. Admitting Physician: No admitting provider for patient encounter. PCP:No PCP Per Patient  Admit Date: 01/19/2015 Discharge date: 01/24/2015  Recommendations for Outpatient Follow-up:  1. Complete 7 more days of oral antibiotics to complete a two-week course. 2. Blood cultures negative at the time of discharge-please follow till final   PRIMARY DISCHARGE DIAGNOSIS:  Principal Problem:   DGI (disseminated gonococcal infection) Active Problems:   HIV disease   Exudative pharyngitis   Polyarthritis   Cervical lymphadenitis   Headache above the eye region      PAST MEDICAL HISTORY: Past Medical History  Diagnosis Date  . Tinea pedis   . History of gonorrhea   . HIV disease march 2016 dx  . Exudative pharyngitis 01/19/2015  . 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 Gonorrhea 01/19/2015    DISCHARGE MEDICATIONS: Current Discharge Medication List    START taking these medications   Details  Alum & Mag Hydroxide-Simeth (MAGIC MOUTHWASH W/LIDOCAINE) SOLN Take 5 mLs by mouth 4 (four) times daily as needed for mouth pain (swish and spit). Qty: 200 mL, Refills: 0    Cefixime (SUPRAX) 400 MG CAPS capsule Take 1 capsule (400 mg total) by mouth 2 (two) times daily. Qty: 14 capsule, Refills: 0    !! menthol-cetylpyridinium (CEPACOL) 3 MG lozenge Take 1 lozenge (3 mg total) by mouth as needed for sore throat. Qty: 100 tablet, Refills: 0     !! - Potential duplicate medications found. Please discuss with provider.    CONTINUE these medications which have NOT CHANGED   Details  acetaminophen (TYLENOL) 500 MG tablet Take 1,000 mg by mouth every 6 (six) hours as needed for mild pain, moderate pain or fever.    !! Menthol 5.8 MG LOZG Use as directed 1 lozenge in the mouth or throat as needed (cold sore).    Phenylephrine-Pheniramine-DM (THERAFLU COLD & COUGH PO) Take 1 Package by  mouth every 8 (eight) hours as needed (sore throat).    PRESCRIPTION MEDICATION Take 2 tablets by mouth daily. Pt is a part of a HIV study drug- [phone] by Dr. Algis Liming    !! research study medication GS-9883/Emtricitabine/TAF or PLACEBO Take one tablet, by mouth, daily with or without food.   DO NOT FILL. PRODUCT PROVIDED BY STUDY. Qty: 30 each, Refills: 11   Associated Diagnoses: Examination of participant in clinical trial; HIV disease    !! research study medication TRIUMEQ or PLACEBO Take one tablet, by mouth, daily with or without food.   DO NOT FILL. PRODUCT PROVIDED BY STUDY. Qty: 30 each, Refills: 11   Associated Diagnoses: Examination of participant in clinical trial; HIV disease     !! - Potential duplicate medications found. Please discuss with provider.    STOP taking these medications     predniSONE (DELTASONE) 10 MG tablet         ALLERGIES:  No Known Allergies  BRIEF HPI:  See H&P, Labs, Consult and Test reports for all details in brief, patient is a 24 year old African-American male who was recently diagnosed with HIV admitted to the hospital with the complaints of sore throat, fever and generalized arthralgias. He has a history of having a gonococcal infection in the past, he had a GC swarm on 6/22 that was positive gonococcus. He was thought to have disseminated gonococcal infection and admitted for further evaluation and treatment  CONSULTATIONS:   ID  PERTINENT RADIOLOGIC STUDIES: Ct Abdomen  Pelvis W Contrast  01/19/2015   CLINICAL DATA:  Abdominal pain. Disseminated gonococcal infection. HIV disease.  EXAM: CT ABDOMEN AND PELVIS WITH CONTRAST  TECHNIQUE: Multidetector CT imaging of the abdomen and pelvis was performed using the standard protocol following bolus administration of intravenous contrast.  CONTRAST:  1 OMNIPAQUE IOHEXOL 300 MG/ML SOLN, 100mL OMNIPAQUE IOHEXOL 300 MG/ML SOLN  COMPARISON:  None.  FINDINGS: Lower chest:  Normal.   Hepatobiliary: Normal.  Pancreas: Normal.  Spleen: Normal.  Adrenals/Urinary Tract: Normal except for 4 mm cyst in the mid right kidney.  Stomach/Bowel: Normal.  Vascular/Lymphatic: Normal.  Reproductive: Normal.  Other: No free air or free fluid.  Musculoskeletal: Prominent central disc protrusion at L4-5. Osseous structures otherwise appear normal.  IMPRESSION: No acute abdominal or pelvic abnormality. Prominent central disc protrusion at L4-5.   Electronically Signed   By: Francene BoyersJames  Maxwell M.D.   On: 01/19/2015 20:37   Dg Bone Density  01/04/2015   : [facility]. Interpretation will be provided by the [facility].   Electronically Signed   By: Amie Portlandavid  Ormond M.D.   On: 01/04/2015 14:16     PERTINENT LAB RESULTS: CBC: No results for input(s): WBC, HGB, HCT, PLT in the last 72 hours. CMET CMP     Component Value Date/Time   NA 137 01/21/2015 0605   K 4.0 01/21/2015 0605   CL 101 01/21/2015 0605   CO2 25 01/21/2015 0605   GLUCOSE 85 01/21/2015 0605   BUN 10 01/21/2015 0605   CREATININE 0.99 01/21/2015 0605   CREATININE 0.84 10/28/2014 1656   CALCIUM 8.9 01/21/2015 0605   PROT 7.9 01/19/2015 1610   ALBUMIN 3.8 01/19/2015 1610   AST 28 01/19/2015 1610   ALT 29 01/19/2015 1610   ALKPHOS 62 01/19/2015 1610   BILITOT 0.7 01/19/2015 1610   GFRNONAA >60 01/21/2015 0605   GFRNONAA >89 10/28/2014 1656   GFRAA >60 01/21/2015 0605   GFRAA >89 10/28/2014 1656    GFR Estimated Creatinine Clearance: 103.6 mL/min (by C-G formula based on Cr of 0.99). No results for input(s): LIPASE, AMYLASE in the last 72 hours. No results for input(s): CKTOTAL, CKMB, CKMBINDEX, TROPONINI in the last 72 hours. Invalid input(s): POCBNP No results for input(s): DDIMER in the last 72 hours. No results for input(s): HGBA1C in the last 72 hours. No results for input(s): CHOL, HDL, LDLCALC, TRIG, CHOLHDL, LDLDIRECT in the last 72 hours. No  results for input(s): TSH, T4TOTAL, T3FREE, THYROIDAB in the last 72 hours.  Invalid input(s): FREET3 No results for input(s): VITAMINB12, FOLATE, FERRITIN, TIBC, IRON, RETICCTPCT in the last 72 hours. Coags: No results for input(s): INR in the last 72 hours.  Invalid input(s): PT Microbiology: Recent Results (from the past 240 hour(s))  Rapid strep screen   (If patient has fever and/or without cough or runny nose)     Status: None   Collection Time: 01/17/15  3:44 PM  Result Value Ref Range Status   Streptococcus, Group A Screen (Direct) NEGATIVE NEGATIVE Final    Comment: (NOTE) A Rapid Antigen test may result negative if the antigen level in the sample is below the detection level of this test. The FDA has not cleared this test as a stand-alone test therefore the rapid antigen negative result has reflexed to a Group A Strep culture.   Culture, Group A Strep     Status: Abnormal   Collection Time: 01/17/15  3:44 PM  Result Value Ref Range Status   Strep A Culture Comment (A)  Final    Comment: (NOTE) Beta-hemolytic colonies, not group A Streptococcus isolated. Performed At: [facility] [address] [place], [place] [id number] Mila Homer MD WU:[id number]   Culture, blood (routine x 2)     Status: None (Preliminary result)   Collection Time: 01/19/15  4:10 PM  Result Value Ref Range Status   Specimen Description BLOOD RIGHT ARM  Final   Special Requests BOTTLES DRAWN AEROBIC AND ANAEROBIC 10CC  Final   Culture   Final    NO GROWTH 4 DAYS Performed at [facility]    Report Status PENDING  Incomplete  Culture, blood (routine x 2)     Status: None (Preliminary result)   Collection Time: 01/19/15  4:10 PM  Result Value Ref Range Status   Specimen Description BLOOD LEFT ARM  Final   Special Requests BOTTLES DRAWN AEROBIC AND ANAEROBIC 10CC  Final   Culture   Final    NO GROWTH 4 DAYS Performed at [facility]    Report Status PENDING   Incomplete     BRIEF HOSPITAL COURSE:  DGI (disseminated gonococcal infection): Presented with fever, and diffuse arthralgias. He outpatient GC swab on 6/22 was positive gonococcus. Patient was Started on IV Rocephin and received 1 dose of Zithromax. With this he slowly improved, he has been afebrile for close to 3 days now, his arthralgias have completely resolved. He continues to have sore throat with some exudate around his tonsils, but much better than the past few days. This M.D. spoke with infectious disease-Dr. Ninetta Lights over the phone, who recommended that we switch to oral cefixime and treat for a total of 2 weeks. He already has a follow-up with Dr. Drue Second at the ID clinic on 7/5 which he has been asked to keep. Patient is also counseled extensively regarding safe sexual practices, he was encouraged to use barrier method/condoms for the next few weeks till seen by infectious disease.  Active Problems: Exudative pharyngitis: Secondary to above. A strep throat culture on 6/20 was positive for beta-hemolytic colonies. This M.D. spoke with Dr. Ninetta Lights who suggested that the above antibiotics was adequate coverage for this as well. These note he has been afebrile, able to tolerate diet. There is no drooling or odynophagia at this time. During this hospital course he was on IV Rocephin.   HIV: Followed by infectious disease, currently on a study drug which is continued. Last CD4 of 490.  TODAY-DAY OF DISCHARGE:  Subjective:   David Hoffman today has no headache,no chest abdominal pain,no new weakness tingling or numbness, feels much better wants to go home today.   Objective:   Blood pressure 114/65, pulse 76, temperature 98.7 F (37.1 C), temperature source Oral, resp. rate 19, height 5' 6.5" (1.689 m), weight 62.596 kg (138 lb), SpO2 100 %.  Intake/Output Summary (Last 24 hours) at 01/24/15 0944 Last data filed at 01/23/15 1900  Gross per 24 hour  Intake    340 ml  Output      0 ml    Net    340 ml   Filed Weights   01/19/15 1456  Weight: 62.596 kg (138 lb)    Exam Awake Alert, Oriented *3, No new F.N deficits, Normal affect [place].AT,PERRAL Supple Neck,No JVD, No cervical lymphadenopathy appriciated.  Throat shows some exudates around his tonsillar areas. Symmetrical Chest wall movement, Good air movement bilaterally, CTAB RRR,No Gallops,Rubs or new Murmurs, No  Parasternal Heave +ve B.Sounds, Abd Soft, Non tender, No organomegaly appriciated, No rebound -guarding or rigidity. No Cyanosis, Clubbing or edema, No new Rash or bruise  DISCHARGE CONDITION: Stable  DISPOSITION: Home  DISCHARGE INSTRUCTIONS:    Activity:  As tolerated   Diet recommendation: Regular Diet  Discharge Instructions    Call MD for:  persistant nausea and vomiting    Complete by:  As directed      Call MD for:  severe uncontrolled pain    Complete by:  As directed      Call MD for:  temperature >100.4    Complete by:  As directed      Diet general    Complete by:  As directed      Increase activity slowly    Complete by:  As directed            Follow-up Information    Follow up with Judyann Munson, MD On 02/01/2015.   Specialty:  Infectious Diseases   Why:  APPT AT 10:45 AM FOR POST HOSPITAL FOLLOW UP   Contact information:   [address] [place] [address] [phone]       Total Time spent on discharge equals  45 minutes.  SignedJeoffrey Massed 01/24/2015 9:44 AM

## 2015-01-25 ENCOUNTER — Emergency Department (HOSPITAL_COMMUNITY)
Admission: EM | Admit: 2015-01-25 | Discharge: 2015-01-25 | Disposition: A | Discharge status: Home / Self Care | Payer: Self-pay | Attending: Emergency Medicine | Admitting: Emergency Medicine

## 2015-01-25 ENCOUNTER — Encounter (HOSPITAL_COMMUNITY): Payer: Self-pay | Admitting: Emergency Medicine

## 2015-01-25 DIAGNOSIS — R52 Pain, unspecified: Secondary | ICD-10-CM | POA: Insufficient documentation

## 2015-01-25 DIAGNOSIS — Z760 Encounter for issue of repeat prescription: Secondary | ICD-10-CM | POA: Insufficient documentation

## 2015-01-25 DIAGNOSIS — Z8739 Personal history of other diseases of the musculoskeletal system and connective tissue: Secondary | ICD-10-CM | POA: Insufficient documentation

## 2015-01-25 DIAGNOSIS — Z87891 Personal history of nicotine dependence: Secondary | ICD-10-CM | POA: Insufficient documentation

## 2015-01-25 DIAGNOSIS — Z79899 Other long term (current) drug therapy: Secondary | ICD-10-CM | POA: Insufficient documentation

## 2015-01-25 DIAGNOSIS — Z21 Asymptomatic human immunodeficiency virus [HIV] infection status: Secondary | ICD-10-CM | POA: Insufficient documentation

## 2015-01-25 MED ORDER — IBUPROFEN 200 MG PO TABS
600.0000 mg | ORAL_TABLET | Freq: Once | ORAL | Status: AC
  Administered 2015-01-25: 600 mg via ORAL
  Filled 2015-01-25: qty 3

## 2015-01-25 MED ORDER — CEFIXIME 400 MG PO CAPS
400.0000 mg | ORAL_CAPSULE | Freq: Once | ORAL | Status: AC
  Administered 2015-01-25: 400 mg via ORAL
  Filled 2015-01-25: qty 1

## 2015-01-25 MED ORDER — MAGIC MOUTHWASH
5.0000 mL | Freq: Once | ORAL | Status: AC
  Administered 2015-01-25: 5 mL via ORAL
  Filled 2015-01-25: qty 5

## 2015-01-25 NOTE — ED Notes (Signed)
Pt c/o generalized body aches and sore throat since this morning.  He also reports a syncopal episode in his apartment this evening but denies hitting his head.  No fever, sweats, chills, nausea, vomiting, diarrhea, and cough.  No meds taken today.

## 2015-01-25 NOTE — Progress Notes (Signed)
[facility].  EDCM spoke to patient at bedside.  Patient confirms he does not have a pcp or insurance.  Patient discharged from hospital yesterday cannot afford Suprax.  [facility].  [facility] provided [facility] with [facility].  Instructed patient that this is a one time medication assist until this time next year, explained that letter will expire in seven days.  Patient reports he will be getting prescription filled ASAP.  [facility], informed patient of services there and walk in times.  EDCM also provided patient with list of pcps who accept self pay patients, list of discount pharmacies and websites needymeds.org and GoodRX.com for medication assistance, phone number to inquire about the orange card, phone number to inquire about Mediciad, phone number to inquire about the Affordable Care Act, financial resources in the community such as local churches, salvation [employer], urban ministries, and dental assistance for uninsured patients.  Patient thankful for resources.  No further EDCM needs at this time.

## 2015-01-25 NOTE — ED Notes (Signed)
Pt states he was discharged from hospital yesterday for gonorrhea/abdominal pain/HIV, states he doesn't feel any better, states he does not need his throat swabbed or the vital signs, states he already knows what is wrong. PA made aware.

## 2015-01-25 NOTE — Discharge Instructions (Signed)
Medication Refill, Emergency Department We have refilled your medication today as a courtesy to you. It is best for your medical care, however, to take care of getting refills done through your primary caregiver's office. They have your records and can do a better job of follow-up than we can in the emergency department. On maintenance medications, we often only prescribe enough medications to get you by until you are able to see your regular caregiver. This is a more expensive way to refill medications. In the future, please plan for refills so that you will not have to use the emergency department for this. Thank you for your help. Your help allows us to better take care of the daily emergencies that enter our department. Document Released: 11/02/2003 Document Revised: 10/08/2011 Document Reviewed: 10/23/2013 [facility] Patient Information 2015 [employer] [place]. This information is not intended to replace advice given to you by your health care provider. Make sure you discuss any questions you have with your health care provider. You have been given a match letter to fill your prescription for Suprax given one dose of Suprax in the emergency department,  you will not need to take this dose when you arrive home,  are please continue to use the Magic mouthwash and ibuprofen or Tylenol on a regular basis for your discomfort

## 2015-01-25 NOTE — ED Provider Notes (Signed)
CSN: [id number]     Arrival date & time 01/25/15  2023 History   First MD Initiated Contact with Patient 01/25/15 2135     Chief Complaint  Patient presents with  . Sore Throat  . Generalized Body Aches     (Consider location/radiation/quality/duration/timing/severity/associated sxs/prior Treatment) HPI Comments: Patient was discharged yesterday after beginning treatment for disseminated gonorrhea and HIV.  He received 3 days of IV Rocephin and was sent home with prescription for Suprax which he is unable to afford.  He states that the Magic mouthwash does not help his sore throat which he states now is on fire.  He states he called the clinic and they told him he would call back on Friday , which doesn't do him any good since he would be 4 days without anti-biotics disrupting his treatment regime `  Patient is a 24 y.o. male presenting with pharyngitis. The history is provided by the patient.  Sore Throat This is a recurrent problem. The current episode started yesterday. The problem occurs constantly. The problem has been unchanged. Associated symptoms include a sore throat and swollen glands. Pertinent negatives include no chills or fever. The symptoms are aggravated by swallowing. He has tried nothing for the symptoms. The treatment provided no relief.    Past Medical History  Diagnosis Date  . Tinea pedis   . History of gonorrhea   . HIV disease march 2016 dx  . Exudative pharyngitis 01/19/2015  . 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 Gonorrhea 01/19/2015   History reviewed. No pertinent past surgical history. Family History  Problem Relation Age of Onset  . Diabetes Mother   . Schizophrenia Maternal Grandmother    History  Substance Use Topics  . Smoking status: Former Smoker    Types: Cigars    Quit date: 11/09/2014  . Smokeless tobacco: Never Used  . Alcohol Use: 0.0 oz/week    0 Standard drinks or equivalent per week     Comment: rare use    Review of Systems   Constitutional: Negative for fever and chills.  HENT: Positive for sore throat and trouble swallowing. Negative for drooling.   All other systems reviewed and are negative.     Allergies  Review of patient's allergies indicates no known allergies.  Home Medications   Prior to Admission medications   Medication Sig Start Date End Date Taking? Authorizing Provider  acetaminophen (TYLENOL) 500 MG tablet Take 1,000 mg by mouth every 6 (six) hours as needed for mild pain, moderate pain or fever.    Historical Provider, MD  Alum & Mag Hydroxide-Simeth (MAGIC MOUTHWASH W/LIDOCAINE) SOLN Take 5 mLs by mouth 4 (four) times daily as needed for mouth pain (swish and spit). 01/24/15   Shanker Levora DredgeM Ghimire, MD  Cefixime (SUPRAX) 400 MG CAPS capsule Take 1 capsule (400 mg total) by mouth 2 (two) times daily. 01/25/15   Shanker Levora DredgeM Ghimire, MD  Menthol 5.8 MG LOZG Use as directed 1 lozenge in the mouth or throat as needed (cold sore).    Historical Provider, MD  menthol-cetylpyridinium (CEPACOL) 3 MG lozenge Take 1 lozenge (3 mg total) by mouth as needed for sore throat. 01/24/15   Shanker Levora DredgeM Ghimire, MD  Phenylephrine-Pheniramine-DM [facility](THERAFLU COLD & COUGH PO) Take 1 Package by mouth every 8 (eight) hours as needed (sore throat).    Historical Provider, MD  PRESCRIPTION MEDICATION Take 2 tablets by mouth daily. Pt is a part of a HIV study drug- [id number] by Dr. Algis LimingVanDam    Historical  Provider, MD  research study medication GS-9883/Emtricitabine/TAF or PLACEBO Take one tablet, by mouth, daily with or without food.   DO NOT FILL. PRODUCT PROVIDED BY STUDY. 01/04/15   Randall Hiss, MD  research study medication TRIUMEQ or PLACEBO Take one tablet, by mouth, daily with or without food.   DO NOT FILL. PRODUCT PROVIDED BY STUDY. 01/04/15   Randall Hiss, MD   BP 152/61 mmHg  Pulse 76  Temp(Src) 98.4 F (36.9 C) (Oral)  Resp 18  Ht  (1.676 m)  Wt 135 lb (61.236 kg)  BMI 21.80 kg/m2  SpO2  99% Physical Exam  Constitutional: He appears well-developed and well-nourished.  HENT:  Head: Normocephalic.  Eyes: Pupils are equal, round, and reactive to light.  Neck: Normal range of motion.  Cardiovascular: Normal rate.   Pulmonary/Chest: Effort normal.  Musculoskeletal: Normal range of motion.  Neurological: He is alert.  Skin: Skin is warm. No rash noted. No erythema.  Nursing note and vitals reviewed.   ED Course  Procedures (including critical care time) Labs Review Labs Reviewed  RAPID STREP SCREEN (NOT AT [facility])    Imaging Review No results found.   EKG Interpretation None     Will have case management come speak with patient.  tp  perhaps help with with antibiosis Case manager.  Most able to give patient a match letter so that his Suprax [facility] at [facility].  He's also been given referrals for primary care physician MDM   Final diagnoses:  Encounter for medication refill         Earley Favor, NP 01/25/15 1610  Lorre Nick, MD 01/26/15 0002

## 2015-02-01 ENCOUNTER — Encounter (INDEPENDENT_AMBULATORY_CARE_PROVIDER_SITE_OTHER): Payer: Self-pay | Admitting: *Deleted

## 2015-02-01 ENCOUNTER — Ambulatory Visit (INDEPENDENT_AMBULATORY_CARE_PROVIDER_SITE_OTHER): Payer: Self-pay | Admitting: Internal Medicine

## 2015-02-01 ENCOUNTER — Encounter: Payer: Self-pay | Admitting: Internal Medicine

## 2015-02-01 VITALS — BP 120/76 | HR 73 | Temp 98.3°F | Wt 130.0 lb

## 2015-02-01 VITALS — BP 120/76 | HR 73 | Temp 98.3°F | Resp 16 | Wt 130.5 lb

## 2015-02-01 DIAGNOSIS — A545 Gonococcal pharyngitis: Secondary | ICD-10-CM

## 2015-02-01 DIAGNOSIS — Z8719 Personal history of other diseases of the digestive system: Secondary | ICD-10-CM

## 2015-02-01 DIAGNOSIS — J029 Acute pharyngitis, unspecified: Secondary | ICD-10-CM

## 2015-02-01 DIAGNOSIS — Z006 Encounter for examination for normal comparison and control in clinical research program: Secondary | ICD-10-CM

## 2015-02-01 DIAGNOSIS — B20 Human immunodeficiency virus [HIV] disease: Secondary | ICD-10-CM

## 2015-02-01 NOTE — Progress Notes (Signed)
Patient ID: David Hoffman, male   DOB: [birth year], 24 y.o.   MRN: [id number]       Patient ID: David Hoffman, male   DOB: 04-Jan-1991, 24 y.o.   MRN: [id number]  HPI David Hoffman is a 24 yo M with HIV disease ,CD 4 count 490/VL 46000 started on research trial in the last 4 -6 weeks. He was hospitalized from 6/22-7/5 treated for disseminated GC. He is feeling much better, finishes his last dose today. He feels much better with improved appetite, no joint pain, no throat pain.  Outpatient Encounter Prescriptions as of 02/01/2015  Medication Sig  . acetaminophen (TYLENOL) 500 MG tablet Take 1,000 mg by mouth every 6 (six) hours as needed for mild pain, moderate pain or fever.  . Alum & Mag Hydroxide-Simeth (MAGIC MOUTHWASH W/LIDOCAINE) SOLN Take 5 mLs by mouth 4 (four) times daily as needed for mouth pain (swish and spit).  . Cefixime (SUPRAX) 400 MG CAPS capsule Take 1 capsule (400 mg total) by mouth 2 (two) times daily.  Marland Kitchen PRESCRIPTION MEDICATION Take 2 tablets by mouth daily. Pt is a part of a HIV study drug- [phone] by Dr. Algis Liming  . research study medication GS-9883/Emtricitabine/TAF or PLACEBO Take one tablet, by mouth, daily with or without food.   DO NOT FILL. PRODUCT PROVIDED BY STUDY.  . research study medication TRIUMEQ or PLACEBO Take one tablet, by mouth, daily with or without food.   DO NOT FILL. PRODUCT PROVIDED BY STUDY.  . Menthol 5.8 MG LOZG Use as directed 1 lozenge in the mouth or throat as needed (cold sore).  Marland Kitchen menthol-cetylpyridinium (CEPACOL) 3 MG lozenge Take 1 lozenge (3 mg total) by mouth as needed for sore throat. (Patient not taking: Reported on 02/01/2015)  . Phenylephrine-Pheniramine-DM (THERAFLU COLD & COUGH PO) Take 1 Package by mouth every 8 (eight) hours as needed (sore throat).   No facility-administered encounter medications on file as of 02/01/2015.     Patient Active Problem List   Diagnosis Date Noted  . Cervical lymphadenitis   . Headache above the  eye region   . Exudative pharyngitis 01/19/2015  . 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 Gonorrhea 01/19/2015  . DGI (disseminated gonococcal infection) 01/19/2015  . Polyarthritis 01/19/2015  . HIV disease 11/30/2014  . Nausea without vomiting 11/30/2014     Health Maintenance Due  Topic Date Due  . TETANUS/TDAP  01/25/2011     Review of Systems Mild sore throat, other complaints resolved Physical Exam   BP 120/76 mmHg  Pulse 73  Temp(Src) 98.3 F (36.8 C) (Oral)  Wt 130 lb (58.968 kg) Physical Exam  Constitutional: He is oriented to person, place, and time. He appears well-developed and well-nourished. No distress.  HENT:  TM clear Mouth/Throat: Oropharynx is has scattered < 5, tonsillar lesions Cardiovascular: Normal rate, regular rhythm and normal heart sounds. Exam reveals no gallop and no friction rub.  No murmur heard.  Pulmonary/Chest: Effort normal and breath sounds normal. No respiratory distress. He has no wheezes.  Lymphadenopathy:  He has no cervical adenopathy.  Skin: Skin is warm and dry. No rash noted. No erythema.  Psychiatric: He has a normal mood and affect. His behavior is normal.    Lab Results  Component Value Date   CD4TCELL 26* 10/28/2014   Lab Results  Component Value Date   CD4TABS 490 10/28/2014   Lab Results  Component Value Date   HIV1RNAQUANT 45990* 10/28/2014   Lab Results  Component Value Date   HEPBSAB POS*  10/28/2014   No results found for: RPR  CBC Lab Results  Component Value Date   WBC 9.2 01/21/2015   RBC 4.95 01/21/2015   HGB 14.1 01/21/2015   HCT 41.8 01/21/2015   PLT 296 01/21/2015   MCV 84.4 01/21/2015   MCH 28.5 01/21/2015   MCHC 33.7 01/21/2015   RDW 12.4 01/21/2015   LYMPHSABS 2.1 01/20/2015   MONOABS 1.9* 01/20/2015   EOSABS 0.0 01/20/2015   BASOSABS 0.0 01/20/2015   BMET Lab Results  Component Value Date   NA 137 01/21/2015   K 4.0 01/21/2015   CL 101 01/21/2015   CO2 25 01/21/2015   GLUCOSE  85 01/21/2015   BUN 10 01/21/2015   CREATININE 0.99 01/21/2015   CALCIUM 8.9 01/21/2015   GFRNONAA >60 01/21/2015   GFRAA >60 01/21/2015     Assessment and Plan  GC of throat = he has nearly finished his treatment course. He is feeling clinically improved. i would expect that this lesions would have been cleared by now.  Exudative pharyngitis = Will screen for gc as well as herpes to unsure no need for further treatment  hiv disease = continue with current research regimen. Continue with excellent adherence

## 2015-02-01 NOTE — Progress Notes (Signed)
David Hoffman is here for his 4 week study visit. He says he feels much better and is now able to eat. His throat is still a little sore, but says everything else has cleared up including the nausea he had before he started meds. He has been very adherent with his meds. The pharmacist is not here today to dispense meds, so I gave him a dose out of his returned bottle for this evening. He will come back tomorrow and pick the meds up. He returns in 4 weeks for the next study visit.

## 2015-02-02 LAB — CYTOLOGY, (ORAL, ANAL, URETHRAL) ANCILLARY ONLY
Chlamydia: NEGATIVE
Neisseria Gonorrhea: NEGATIVE

## 2015-02-03 LAB — HERPES SIMPLEX VIRUS CULTURE: Organism ID, Bacteria: DETECTED

## 2015-02-09 ENCOUNTER — Encounter: Payer: Self-pay | Admitting: Infectious Disease

## 2015-02-09 LAB — CD4/CD8 (T-HELPER/T-SUPPRESSOR CELL)
CD4%: 30.8
CD4: 708

## 2015-02-09 LAB — HIV-1 RNA QUANT-NO REFLEX-BLD

## 2015-02-15 ENCOUNTER — Other Ambulatory Visit: Payer: Self-pay | Admitting: Internal Medicine

## 2015-02-15 DIAGNOSIS — B009 Herpesviral infection, unspecified: Secondary | ICD-10-CM

## 2015-02-15 MED ORDER — VALACYCLOVIR HCL 1 G PO TABS: 1000.0000 mg | ORAL_TABLET | Freq: Two times a day (BID) | ORAL | Status: DC

## 2015-02-15 NOTE — Progress Notes (Signed)
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 Called patient to see if lesions have resolved. If not, can do a 7 day trial of valtrex 1gm BID as needed for outbreaks

## 2015-02-28 ENCOUNTER — Telehealth: Payer: Self-pay | Admitting: *Deleted

## 2015-02-28 NOTE — Telephone Encounter (Signed)
Patient called and advised he was out of town for the past month and wants to know what his results are from the throat swab. Advised him + Herpes and asked him if he was still having symptoms. He advised no but asked what is he to do now. Advised him he sees Mining engineer and he can ask her any questions he has and if she feels he needs something daily for suppression Dr Drue Second can send something in for him. Just to let us know.

## 2015-03-01 ENCOUNTER — Ambulatory Visit: Payer: Self-pay

## 2015-03-01 ENCOUNTER — Encounter (INDEPENDENT_AMBULATORY_CARE_PROVIDER_SITE_OTHER): Payer: Self-pay | Admitting: *Deleted

## 2015-03-01 ENCOUNTER — Encounter: Payer: Self-pay | Admitting: *Deleted

## 2015-03-01 VITALS — BP 116/80 | HR 71 | Temp 98.4°F | Resp 16 | Wt 133.5 lb

## 2015-03-01 DIAGNOSIS — Z006 Encounter for examination for normal comparison and control in clinical research program: Secondary | ICD-10-CM

## 2015-03-01 NOTE — Progress Notes (Signed)
David Hoffman is here for [name], week 8. He denies any new symptoms or signs. He still has a sore throat. I asked if he started the prescribed Valtrex and he stated he had not picked it up. I called that pharmacy to get it refilled so he can pick it up today. He stopped taking magic mouthwash and menthol-cetylpyridinium one week after being discharged from hospital. Study pills were counted: #3 remains of GS-9883/F/TAF/Placebo and #4 left of Triumeq/Placebo. I asked him about the inconsistent amount and he stated he dropped one of the brown ones in the sink. Fasting labs were obtained. Last time study drug was taken was 10 am yesterday with food. Study medications were dispensed. He received $50 gift card for visit and next appointment scheduled for March 29, 2015 @ 9am. Tacey Heap RN

## 2015-03-29 ENCOUNTER — Encounter (INDEPENDENT_AMBULATORY_CARE_PROVIDER_SITE_OTHER): Payer: Self-pay | Admitting: *Deleted

## 2015-03-29 ENCOUNTER — Other Ambulatory Visit: Payer: Self-pay | Admitting: Infectious Disease

## 2015-03-29 VITALS — BP 123/76 | HR 66 | Temp 98.5°F | Resp 16 | Wt 137.0 lb

## 2015-03-29 DIAGNOSIS — Z006 Encounter for examination for normal comparison and control in clinical research program: Secondary | ICD-10-CM

## 2015-03-29 NOTE — Progress Notes (Signed)
David Hoffman was here for his 12 week [name] study visit. He brought all his pill bottles back and had 7 in each bottle left, when he should have had only 2 left. He remembers missing 1 dose over a week ago when his work schedules changed, but doesn't know why the he has more left than he should. We discussed adherence importance and strategies for him to stay on track, especially since he will not be back for 12 weeks. He denies any new problems and says his sore throat has completely healed up. He ate last at 1 am and we drew the trough PK at 9:20, dosed at 9:26 and post dose PK drawn at 10:46. He will return on 11/21 for the week 24 visit. He will also see Dr. Daiva Eves and have the dexa scan that day.

## 2015-04-21 ENCOUNTER — Encounter: Payer: Self-pay | Admitting: Infectious Disease

## 2015-04-21 LAB — CD4/CD8 (T-HELPER/T-SUPPRESSOR CELL)
CD4%: 30.3
CD4: 859

## 2015-04-21 LAB — HIV-1 RNA QUANT-NO REFLEX-BLD

## 2015-05-09 NOTE — Progress Notes (Signed)
Notified [facility] via fax.  Almetta Liddicoat M, RN  

## 2015-05-12 ENCOUNTER — Encounter: Payer: Self-pay | Admitting: Infectious Disease

## 2015-06-20 ENCOUNTER — Ambulatory Visit: Admission: RE | Admit: 2015-06-20 | Payer: Self-pay | Source: Ambulatory Visit

## 2015-06-20 ENCOUNTER — Encounter: Payer: Self-pay | Admitting: Infectious Disease

## 2015-06-20 ENCOUNTER — Encounter: Payer: Self-pay | Admitting: *Deleted

## 2015-06-20 ENCOUNTER — Other Ambulatory Visit: Payer: Self-pay

## 2015-06-20 ENCOUNTER — Ambulatory Visit (INDEPENDENT_AMBULATORY_CARE_PROVIDER_SITE_OTHER): Payer: Self-pay | Admitting: Infectious Disease

## 2015-06-20 VITALS — BP 136/88 | HR 69 | Temp 97.7°F | Wt 139.0 lb

## 2015-06-20 VITALS — Resp 16

## 2015-06-20 DIAGNOSIS — Z23 Encounter for immunization: Secondary | ICD-10-CM

## 2015-06-20 DIAGNOSIS — Z006 Encounter for examination for normal comparison and control in clinical research program: Secondary | ICD-10-CM

## 2015-06-20 LAB — HIV-1 RNA QUANT-NO REFLEX-BLD: HIV-1 RNA Viral Load: <20

## 2015-06-20 LAB — CD4/CD8 (T-HELPER/T-SUPPRESSOR CELL)
CD4%: 39
CD4: 757

## 2015-06-20 NOTE — Progress Notes (Signed)
David Hoffman is here for [name]. Version 2.0 revised consent was explained/reviewed and questions were answered. Comprehension was assessed and consent was signed. A copy was given to participant. He denies any new symptoms, problems, or medications. States he has been adherent and did double up one day accidentally. Pill count performed and #18 remain for each. ECG performed and Fasting labs obtained. DXA not performed since new DXA machine has not been approved by study team. Study meds were dispensed and he received $50 giftcard for visit. Next appointment scheduled for Feb 13 @ 9am. Tacey HeapElisha Epperson

## 2015-06-20 NOTE — Progress Notes (Signed)
Subjective:    Patient ID: David Hoffman, male    DOB: 06/18/1991, 24 y.o.   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  Past Medical History  Diagnosis Date  . Tinea pedis   . History of gonorrhea   . HIV disease [facility]) march 2016 dx  . Exudative pharyngitis 01/19/2015  . 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 Gonorrhea 01/19/2015    No past surgical history on file.  Family History  Problem Relation Age of Onset  . Diabetes Mother   . Schizophrenia Maternal Grandmother       Social History   Social History  . Marital Status: Single    Spouse Name: N/A  . Number of Children: N/A  . Years of Education: N/A   Social History Main Topics  . Smoking status: Current Every Day Smoker    Types: Cigars    Last Attempt to Quit: 11/09/2014  . Smokeless tobacco: Never Used  . Alcohol Use: 0.0 oz/week    0 Standard drinks or equivalent per week     Comment: rare use  . Drug Use: Yes    Special: Marijuana  . Sexual Activity:    Partners: Male     Comment: pt declined condoms   Other Topics Concern  . None   Social History Narrative    No Known Allergies   Current outpatient prescriptions:  .  acetaminophen (TYLENOL) 500 MG tablet, Take 1,000 mg by mouth every 6 (six) hours as needed for mild pain, moderate pain or fever., Disp: , Rfl:  .  Alum & Mag Hydroxide-Simeth (MAGIC MOUTHWASH W/LIDOCAINE) SOLN, Take 5 mLs by mouth 4 (four) times daily as needed for  mouth pain (swish and spit)., Disp: 200 mL, Rfl: 0 .  Menthol 5.8 MG LOZG, Use as directed 1 lozenge in the mouth or throat as needed (cold sore)., Disp: , Rfl:  .  menthol-cetylpyridinium (CEPACOL) 3 MG lozenge, Take 1 lozenge (3 mg total) by mouth as needed for sore throat., Disp: 100 tablet, Rfl: 0 .  Phenylephrine-Pheniramine-DM (THERAFLU COLD & COUGH PO), Take 1 Package by mouth every 8 (eight) hours as needed (sore throat)., Disp: , Rfl:  .  PRESCRIPTION MEDICATION, Take 2 tablets by mouth daily. Pt is a part of a HIV study drug- [id number] by Dr. Algis LimingVanDam, Disp: , Rfl:  .  research study medication, GS-9883/Emtricitabine/TAF or PLACEBO Take one tablet, by mouth, daily with or without food.   DO NOT FILL. PRODUCT PROVIDED BY STUDY., Disp: 30 each, Rfl: 11 .  research study medication, TRIUMEQ or PLACEBO Take one tablet, by mouth, daily with or without food.   DO NOT FILL. PRODUCT PROVIDED BY STUDY., Disp: 30 each, Rfl: 11 .  valACYclovir (VALTREX) 1000 MG tablet, Take 1 tablet (1,000 mg total) by mouth 2 (two) times daily. X 7 days. Can use as needed for oral ulcers, Disp: 30 tablet, Rfl: 0     Review of Systems  Constitutional: Negative  for fever, chills, diaphoresis, activity change, appetite change, fatigue and unexpected weight change.  HENT: Negative for congestion, rhinorrhea, sinus pressure, sneezing, sore throat and trouble swallowing.   Eyes: Negative for photophobia and visual disturbance.  Respiratory: Negative for cough, chest tightness, shortness of breath, wheezing and stridor.   Cardiovascular: Negative for chest pain, palpitations and leg swelling.  Gastrointestinal: Negative for nausea, vomiting, abdominal pain, diarrhea, constipation, blood in stool, abdominal distention and anal bleeding.  Endocrine: Negative for cold intolerance, heat intolerance, polyphagia and polyuria.  Genitourinary: Negative for dysuria, hematuria, flank pain and difficulty urinating.    Musculoskeletal: Negative for myalgias, back pain, joint swelling, arthralgias and gait problem.  Skin: Negative for color change, pallor, rash and wound.  Neurological: Negative for dizziness, tremors, seizures, syncope, speech difficulty, weakness, light-headedness and numbness.  Hematological: Negative for adenopathy. Does not bruise/bleed easily.  Psychiatric/Behavioral: Negative for behavioral problems, confusion, sleep disturbance, dysphoric mood, decreased concentration and agitation.       Objective:   Physical Exam  Constitutional: He is oriented to person, place, and time. He appears well-developed and well-nourished.  HENT:  Head: Normocephalic and atraumatic.  Mouth/Throat: Uvula is midline, oropharynx is clear and moist and mucous membranes are normal. No oropharyngeal exudate, posterior oropharyngeal edema or posterior oropharyngeal erythema.  Eyes: Conjunctivae and EOM are normal. Pupils are equal, round, and reactive to light. Right eye exhibits no discharge. Left eye exhibits no discharge. No scleral icterus.  Neck: Normal range of motion. Neck supple. No JVD present. No tracheal deviation present. No thyromegaly present.  Cardiovascular: Normal rate, regular rhythm and normal heart sounds.  Exam reveals no gallop and no friction rub.   No murmur heard. Pulmonary/Chest: Effort normal and breath sounds normal. No respiratory distress. He has no wheezes. He has no rales. He exhibits no tenderness.  Abdominal: Soft. Bowel sounds are normal. He exhibits no distension. There is no tenderness. There is no rebound and no guarding.  Musculoskeletal: Normal range of motion. He exhibits no edema.       Right elbow: He exhibits no effusion. Tenderness found.       Left elbow: He exhibits no effusion. Tenderness found.       Right knee: He exhibits swelling. He exhibits no effusion. Tenderness found.       Left knee: He exhibits no swelling and no effusion. Tenderness found.        Back:  Lymphadenopathy:       Head (right side): No submandibular, no preauricular and no posterior auricular adenopathy present.       Head (left side): No submandibular, no preauricular and no posterior auricular adenopathy present.    He has no cervical adenopathy.    He has no axillary adenopathy.  Neurological: He is alert and oriented to person, place, and time. No cranial nerve deficit. Coordination normal.  Skin: Skin is warm and dry. No rash noted. No erythema. No pallor.  Psychiatric: He has a normal mood and affect. His behavior is normal. Judgment and thought content normal.          Assessment & Plan:   Exudative pharyngitis with diffuse polyarticular arthritis and high fevers in man with prior hx of Gonorrhea. This is highly suspicious for DGI  --I have sent oropharynx swab for GC probe in our clinic --I am trying to have him directly admitted to [facility] --on admission I would like to have    HIV: see above and continue TWO pills daily from study  which is now extended to 144 weeks  EKG checked and shows NSR  DGI: not recurred  HSV2 isolated from mouth but no clinical disease

## 2015-06-29 ENCOUNTER — Encounter: Payer: Self-pay | Admitting: Infectious Disease

## 2015-09-06 ENCOUNTER — Ambulatory Visit
Admission: RE | Admit: 2015-09-06 | Discharge: 2015-09-06 | Disposition: A | Discharge status: Home / Self Care | Payer: No Typology Code available for payment source | Source: Ambulatory Visit | Attending: Infectious Disease | Admitting: Infectious Disease

## 2015-09-06 DIAGNOSIS — Z006 Encounter for examination for normal comparison and control in clinical research program: Secondary | ICD-10-CM

## 2015-09-12 ENCOUNTER — Other Ambulatory Visit: Payer: Self-pay | Admitting: Infectious Diseases

## 2015-09-12 ENCOUNTER — Other Ambulatory Visit: Payer: Self-pay | Admitting: Infectious Disease

## 2015-09-12 ENCOUNTER — Encounter (INDEPENDENT_AMBULATORY_CARE_PROVIDER_SITE_OTHER): Payer: Self-pay | Admitting: *Deleted

## 2015-09-12 VITALS — BP 114/70 | HR 65 | Temp 97.6°F | Resp 16 | Wt 140.5 lb

## 2015-09-12 DIAGNOSIS — Z23 Encounter for immunization: Secondary | ICD-10-CM

## 2015-09-12 DIAGNOSIS — Z006 Encounter for examination for normal comparison and control in clinical research program: Secondary | ICD-10-CM

## 2015-09-12 DIAGNOSIS — B350 Tinea barbae and tinea capitis: Secondary | ICD-10-CM

## 2015-09-12 MED ORDER — FLUCONAZOLE 150 MG PO TABS: 150.0000 mg | ORAL_TABLET | ORAL | Status: DC

## 2015-09-12 NOTE — Progress Notes (Signed)
David Hoffman is here for his week 36 visit for Gilead [phone]: Phase 3 Study to Evaluate the Safety and Efficacy of GS-9883/Descovy Versus Triumeq for HIV Positive Treatment Naive Individuals.. He says he hasn't had any problem taking his meds as long as he eats a meal with them. He took his last dose at 8:30 pm last night with a meal. He had missed 12 doses since the last visit in November. He remembers missing a few around Christmas but he did not think it was that many. We discussed strategies to help him remember to take his meds such as setting an alarm on his watch daily to remind him to take the meds. He usually takes them in the evening sometime but it may be a few hours off each day. He received his HPV vaccine #1 today after blood was drawn for the study. He has several circular round lesions around his mouth that look very consistent for ringworm. Dr. Ninetta Lights looked at them and agreed and prescribed lamisil cream and fluconazole to take weekly for 3 doses. [name] said they started showing up a day or two after he went to the barbershop and had a shave about 2 weeks ago. He will be returning early next time for his week 48 visit on March 29th. At thattime, he will be seeing Dr. Daiva Eves, getting a DEXA again and his 2nd HPV shot.

## 2015-09-21 ENCOUNTER — Encounter: Payer: Self-pay | Admitting: *Deleted

## 2015-09-21 NOTE — Progress Notes (Signed)
I would probably also test him for syphilis as it is more easily transmissible with intimate contact, not always requiring intercourse

## 2015-09-21 NOTE — Progress Notes (Signed)
Sachin came by the clinic today because he was concerned about some discoloration,dryness and peeling around his genitals. He noticed it first last week and then noticed a small hardened area on his penis. On examination, his penis and scrotum are dry and ashy. There is some area of skin peeling and sloughing of skin. No redness or vesicles. He denies any sexual contact past 2 months, but did say he has been masturbating and using the same lotion he always has and/or spit for lubrication. He has not noticed any redness, itching or penile discharge. 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 I discussed this with Dr. Drue Second who said it may be "fungal' and to have him try lotrimin cream for several days to see if it helps. He does have athlete's feet (he says for years now) and uses lotrimin cream. I told Herrick to try that for a week and let me know if it helps.

## 2015-09-22 ENCOUNTER — Encounter: Payer: Self-pay | Admitting: Infectious Disease

## 2015-09-22 LAB — CD4/CD8 (T-HELPER/T-SUPPRESSOR CELL)
CD4%: 30.4
CD4: 615

## 2015-09-22 LAB — HIV-1 RNA QUANT-NO REFLEX-BLD: HIV-1 RNA Viral Load: <20

## 2015-10-11 ENCOUNTER — Encounter: Payer: Self-pay | Admitting: Infectious Disease

## 2015-10-24 ENCOUNTER — Other Ambulatory Visit: Payer: Self-pay

## 2015-10-24 ENCOUNTER — Other Ambulatory Visit: Payer: Self-pay | Admitting: Infectious Disease

## 2015-10-24 DIAGNOSIS — Z006 Encounter for examination for normal comparison and control in clinical research program: Secondary | ICD-10-CM

## 2015-10-26 ENCOUNTER — Other Ambulatory Visit: Payer: Self-pay | Admitting: Infectious Disease

## 2015-10-26 ENCOUNTER — Ambulatory Visit (INDEPENDENT_AMBULATORY_CARE_PROVIDER_SITE_OTHER): Payer: Self-pay | Admitting: Infectious Disease

## 2015-10-26 ENCOUNTER — Encounter: Payer: Self-pay | Admitting: Infectious Disease

## 2015-10-26 ENCOUNTER — Encounter (INDEPENDENT_AMBULATORY_CARE_PROVIDER_SITE_OTHER): Payer: Self-pay | Admitting: *Deleted

## 2015-10-26 ENCOUNTER — Inpatient Hospital Stay: Admission: RE | Admit: 2015-10-26 | Payer: No Typology Code available for payment source | Source: Ambulatory Visit

## 2015-10-26 VITALS — BP 117/77 | HR 73 | Temp 97.9°F | Wt 136.0 lb

## 2015-10-26 VITALS — BP 117/79 | HR 73 | Temp 97.9°F | Resp 16 | Wt 136.5 lb

## 2015-10-26 DIAGNOSIS — Z113 Encounter for screening for infections with a predominantly sexual mode of transmission: Secondary | ICD-10-CM

## 2015-10-26 DIAGNOSIS — Z23 Encounter for immunization: Secondary | ICD-10-CM

## 2015-10-26 DIAGNOSIS — A5486 Gonococcal sepsis: Secondary | ICD-10-CM

## 2015-10-26 DIAGNOSIS — B20 Human immunodeficiency virus [HIV] disease: Secondary | ICD-10-CM

## 2015-10-26 DIAGNOSIS — A09 Infectious gastroenteritis and colitis, unspecified: Secondary | ICD-10-CM

## 2015-10-26 DIAGNOSIS — Z21 Asymptomatic human immunodeficiency virus [HIV] infection status: Secondary | ICD-10-CM

## 2015-10-26 DIAGNOSIS — Z006 Encounter for examination for normal comparison and control in clinical research program: Secondary | ICD-10-CM

## 2015-10-26 DIAGNOSIS — K1370 Unspecified lesions of oral mucosa: Secondary | ICD-10-CM

## 2015-10-26 DIAGNOSIS — R590 Localized enlarged lymph nodes: Secondary | ICD-10-CM | POA: Insufficient documentation

## 2015-10-26 DIAGNOSIS — K529 Noninfective gastroenteritis and colitis, unspecified: Secondary | ICD-10-CM

## 2015-10-26 HISTORY — DX: Noninfective gastroenteritis and colitis, unspecified: K52.9

## 2015-10-26 HISTORY — DX: Localized enlarged lymph nodes: R59.0

## 2015-10-26 HISTORY — DX: Unspecified lesions of oral mucosa: K13.70

## 2015-10-26 LAB — CBC WITH DIFFERENTIAL/PLATELET
BASOS ABS: 0 10*3/uL (ref 0.0–0.1)
BASOS PCT: 0 % (ref 0–1)
EOS ABS: 0.1 10*3/uL (ref 0.0–0.7)
Eosinophils Relative: 1 % (ref 0–5)
HCT: 43.8 % (ref 39.0–52.0)
HEMOGLOBIN: 15 g/dL (ref 13.0–17.0)
Lymphocytes Relative: 17 % (ref 12–46)
Lymphs Abs: 1.6 10*3/uL (ref 0.7–4.0)
MCH: 28.7 pg (ref 26.0–34.0)
MCHC: 34.2 g/dL (ref 30.0–36.0)
MCV: 83.7 fL (ref 78.0–100.0)
MPV: 8.8 fL (ref 8.6–12.4)
Monocytes Absolute: 0.9 10*3/uL (ref 0.1–1.0)
Monocytes Relative: 9 % (ref 3–12)
NEUTROS ABS: 7 10*3/uL (ref 1.7–7.7)
NEUTROS PCT: 73 % (ref 43–77)
PLATELETS: 398 10*3/uL (ref 150–400)
RBC: 5.23 MIL/uL (ref 4.22–5.81)
RDW: 13.3 % (ref 11.5–15.5)
WBC: 9.6 10*3/uL (ref 4.0–10.5)

## 2015-10-26 LAB — COMPREHENSIVE METABOLIC PANEL
ALK PHOS: 77 U/L (ref 40–115)
ALT: 16 U/L (ref 9–46)
AST: 16 U/L (ref 10–40)
Albumin: 4.5 g/dL (ref 3.6–5.1)
BILIRUBIN TOTAL: 0.4 mg/dL (ref 0.2–1.2)
BUN: 10 mg/dL (ref 7–25)
CHLORIDE: 102 mmol/L (ref 98–110)
CO2: 26 mmol/L (ref 20–31)
CREATININE: 0.86 mg/dL (ref 0.60–1.35)
Calcium: 9.9 mg/dL (ref 8.6–10.3)
Glucose, Bld: 101 mg/dL — ABNORMAL HIGH (ref 65–99)
Potassium: 4.6 mmol/L (ref 3.5–5.3)
SODIUM: 139 mmol/L (ref 135–146)
TOTAL PROTEIN: 8 g/dL (ref 6.1–8.1)

## 2015-10-26 MED ORDER — DIPHENOXYLATE-ATROPINE 2.5-0.025 MG PO TABS: 1.0000 | ORAL_TABLET | Freq: Four times a day (QID) | ORAL | Status: DC | PRN

## 2015-10-26 MED ORDER — PROMETHAZINE HCL 50 MG PO TABS: 50.0000 mg | ORAL_TABLET | Freq: Four times a day (QID) | ORAL | Status: DC | PRN

## 2015-10-26 NOTE — Progress Notes (Signed)
David Hoffman is here today fro the week 48 study visit. He says he has been throwing up and having diarrhea since around 3 am. He has ulcers in the roof of his mouth which have been bothering him for the past 3 weeks. He had noticed his glands in his neck swelling around that time too and has been very fatigued. I will have him see Dr. Daiva EvesVan Dam before doing the visit which he was supposed to see him anyway for this research visit. We are unable to complete the visit due to him feeling "poorly" and will have him come back in a few days. He went to get his DEXA exam this morning but arrived a little too late and told him it would need to be rescheduled. Phenergan and lomotil were prescribed by Dr. Daiva EvesVan Dam.

## 2015-10-26 NOTE — Progress Notes (Signed)
Subjective:   Chief complaint: Here for follow-up visit for Gilead study for CPE for DEXA scan. Note patient is suffering from apparent bout of acute gastroenteritis with severe nausea vomiting loose stools and malaise. His review of systems also pertinent for cervical lymphadenopathy over the last 3 weeks and oral lesions on the roof of his mouth that have not disappeared over time.    Patient ID: David Hoffman, male    DOB: 1990-12-06, 25 y.o.   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 Apparently several individuals at work have been sick with similar illness and now he is with severe nausea just try to move him from sitting to supine elicited severe nausea are to be go to the bathroom immediately. Is also suffering from loose stools as well.  His review of systems is also positive for cervical lymphadenopathy of the last 3 weeks as well as some lesions in his mouth that of fail to resolve.  Past Medical History  Diagnosis Date  . Tinea pedis   . History of gonorrhea   . HIV disease [facility]) march 2016 dx  . Exudative pharyngitis 01/19/2015  . 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 Gonorrhea 01/19/2015  . Gastroenteritis presumed infectious 10/26/2015  . Oral lesion 10/26/2015  . Cervical lymphadenopathy 10/26/2015    No past surgical history on file.  Family History  Problem Relation Age of Onset  . Diabetes Mother   . Schizophrenia Maternal  Grandmother       Social History   Social History  . Marital Status: Single    Spouse Name: N/A  . Number of Children: N/A  . Years of Education: N/A   Social History Main Topics  . Smoking status: Current Every Day Smoker    Types: Cigars    Last Attempt to Quit: 11/09/2014  . Smokeless tobacco: Never Used  . Alcohol Use: 0.0 oz/week    0 Standard drinks or equivalent per week     Comment: rare use  . Drug Use: Yes    Special: Marijuana  . Sexual Activity:    Partners: Male     Comment: pt declined condoms   Other Topics Concern  . None   Social History Narrative    No Known Allergies   Current outpatient prescriptions:  .  acetaminophen (TYLENOL) 500 MG tablet, Take 1,000 mg by mouth every 6 (six) hours as needed for mild pain, moderate pain or fever., Disp: , Rfl:  .  PRESCRIPTION MEDICATION, Take 2 tablets by mouth daily. Pt is a part of a HIV study drug- GS-[phone] by Dr. Algis Liming, Disp: , Rfl:  .  research study medication, GS-9883/Emtricitabine/TAF or PLACEBO Take one tablet, by mouth, daily with or without food.   DO NOT FILL. PRODUCT PROVIDED BY STUDY., Disp: 30 each, Rfl: 11 .  research study medication, TRIUMEQ or PLACEBO Take one tablet, by mouth, daily with or without food.   DO NOT FILL. PRODUCT PROVIDED  BY STUDY., Disp: 30 each, Rfl: 11 .  diphenoxylate-atropine (LOMOTIL) 2.5-0.025 MG tablet, Take 1 tablet by mouth 4 (four) times daily as needed for diarrhea or loose stools., Disp: 60 tablet, Rfl: 0 .  fluconazole (DIFLUCAN) 150 MG tablet, Take 1 tablet (150 mg total) by mouth once a week. (Patient not taking: Reported on 10/26/2015), Disp: 3 tablet, Rfl: 0 .  promethazine (PHENERGAN) 50 MG tablet, Take 1 tablet (50 mg total) by mouth every 6 (six) hours as needed for nausea or vomiting., Disp: 60 tablet, Rfl: 2 .  valACYclovir (VALTREX) 1000 MG tablet, Take 1 tablet (1,000 mg total) by mouth 2 (two) times daily. X 7 days. Can use as needed for oral ulcers  (Patient not taking: Reported on 10/26/2015), Disp: 30 tablet, Rfl: 0     Review of Systems  Constitutional: Positive for fever and fatigue. Negative for chills, diaphoresis, activity change, appetite change and unexpected weight change.  HENT: Positive for mouth sores and sore throat. Negative for congestion, rhinorrhea, sinus pressure, sneezing and trouble swallowing.   Eyes: Negative for photophobia and visual disturbance.  Respiratory: Negative for cough, chest tightness, shortness of breath, wheezing and stridor.   Cardiovascular: Negative for chest pain, palpitations and leg swelling.  Gastrointestinal: Positive for nausea, vomiting, abdominal pain and diarrhea. Negative for constipation, blood in stool, abdominal distention and anal bleeding.  Endocrine: Negative for cold intolerance, heat intolerance, polyphagia and polyuria.  Genitourinary: Negative for dysuria, hematuria, flank pain and difficulty urinating.  Musculoskeletal: Negative for myalgias, back pain, joint swelling, arthralgias and gait problem.  Skin: Negative for color change, pallor, rash and wound.  Neurological: Positive for dizziness. Negative for tremors, seizures, syncope, speech difficulty, weakness, light-headedness and numbness.  Hematological: Negative for adenopathy. Does not bruise/bleed easily.  Psychiatric/Behavioral: Negative for behavioral problems, confusion, sleep disturbance, dysphoric mood, decreased concentration and agitation.       Objective:   Physical Exam  Constitutional: He is oriented to person, place, and time. He appears well-developed and well-nourished. He appears distressed.  HENT:  Head: Normocephalic and atraumatic.  Mouth/Throat: Uvula is midline, oropharynx is clear and moist and mucous membranes are normal. No oropharyngeal exudate, posterior oropharyngeal edema or posterior oropharyngeal erythema.    Eyes: Conjunctivae and EOM are normal. Pupils are equal, round, and reactive to  light. Right eye exhibits no discharge. Left eye exhibits no discharge. No scleral icterus.  Neck: Normal range of motion. Neck supple. No JVD present. No tracheal deviation present. No thyromegaly present.  Cardiovascular: Normal rate, regular rhythm and normal heart sounds.  Exam reveals no gallop and no friction rub.   No murmur heard. Pulmonary/Chest: Effort normal and breath sounds normal. No respiratory distress. He has no wheezes. He has no rales. He exhibits no tenderness.  Abdominal: Soft. Bowel sounds are normal. He exhibits no distension. There is no hepatosplenomegaly. There is generalized tenderness. There is no rebound, no guarding and no CVA tenderness.  Musculoskeletal: Normal range of motion. He exhibits no edema.       Right elbow: He exhibits no effusion. Tenderness found.       Left elbow: He exhibits no effusion. Tenderness found.       Right knee: He exhibits swelling. He exhibits no effusion. Tenderness found.       Left knee: He exhibits no swelling and no effusion. Tenderness found.  Lymphadenopathy:       Head (right side): No submandibular, no preauricular and no posterior auricular adenopathy present.  Head (left side): No submandibular, no preauricular and no posterior auricular adenopathy present.    He has cervical adenopathy.       Right cervical: Superficial cervical and deep cervical adenopathy present.       Left cervical: Superficial cervical and deep cervical adenopathy present.    He has no axillary adenopathy.       Right axillary: No pectoral adenopathy present.       Left axillary: No pectoral adenopathy present.      Right: No supraclavicular adenopathy present.       Left: No supraclavicular adenopathy present.  Neurological: He is alert and oriented to person, place, and time. No cranial nerve deficit. Coordination normal.  Skin: Skin is warm. No rash noted. He is diaphoretic. No erythema. No pallor.  Psychiatric: He has a normal mood and  affect. His behavior is normal. Judgment and thought content normal.          Assessment & Plan:   Likely viral gastroenteritis: I have sent in prescriptions for Phenergan and for Lomotil he sustained hydrated labs will be done next week.  Oral lesion on roof of mouth: Concerned this could be HPV related pathology and he may need a biopsy. We could try a course of fluconazole when he is better from his acute viral gastroenteritis but this is not look like thrush to me. 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

## 2015-10-27 ENCOUNTER — Ambulatory Visit (INDEPENDENT_AMBULATORY_CARE_PROVIDER_SITE_OTHER): Payer: No Typology Code available for payment source | Admitting: Licensed Clinical Social Worker

## 2015-10-27 ENCOUNTER — Telehealth: Payer: Self-pay | Admitting: *Deleted

## 2015-10-27 DIAGNOSIS — A549 Gonococcal infection, unspecified: Secondary | ICD-10-CM

## 2015-10-27 LAB — CYTOLOGY, (ORAL, ANAL, URETHRAL) ANCILLARY ONLY
Chlamydia: NEGATIVE
Chlamydia: NEGATIVE
NEISSERIA GONORRHEA: POSITIVE — AB
Neisseria Gonorrhea: NEGATIVE

## 2015-10-27 LAB — URINE CYTOLOGY ANCILLARY ONLY
CHLAMYDIA, DNA PROBE: NEGATIVE
NEISSERIA GONORRHEA: NEGATIVE

## 2015-10-27 MED ORDER — CEFTRIAXONE SODIUM 1 G IJ SOLR
250.0000 mg | Freq: Once | INTRAMUSCULAR | Status: AC
  Administered 2015-10-27: 250 mg via INTRAMUSCULAR

## 2015-10-27 MED ORDER — AZITHROMYCIN 250 MG PO TABS
1000.0000 mg | ORAL_TABLET | Freq: Once | ORAL | Status: AC
  Administered 2015-10-27: 1000 mg via ORAL

## 2015-10-27 NOTE — Telephone Encounter (Signed)
Patient treated by research during his appointment today.

## 2015-10-27 NOTE — Telephone Encounter (Signed)
Left generic message asking patient to call back ASAP for lab results and to set up treatment. Andree CossHowell, Vendetta Pittinger M, RN

## 2015-10-27 NOTE — Progress Notes (Signed)
Patient was given rocephin 250 mg IM in the right deltoid along with 1000 mg of azithromycin by mouth.

## 2015-10-27 NOTE — Telephone Encounter (Signed)
-----   Message from Randall Hissornelius N Van Dam, MD sent at 10/27/2015 10:44 AM EDT ----- David Hoffman has gonorrhea yet again this time in his rectum. He needs a shot of ceftriaxone 250 mg and one dose of azithromycin 1 gram x 1 to treat this. His partners also need to be treated. He should use condoms as well to prevent this recurring

## 2015-10-27 NOTE — Telephone Encounter (Signed)
He is probably still recovering still from his viral gastroenteritis

## 2015-10-28 ENCOUNTER — Inpatient Hospital Stay: Admission: RE | Admit: 2015-10-28 | Payer: No Typology Code available for payment source | Source: Ambulatory Visit

## 2015-10-28 LAB — FLUORESCENT TREPONEMAL AB(FTA)-IGG-BLD: Fluorescent Treponemal ABS: REACTIVE — AB

## 2015-10-28 LAB — RPR TITER

## 2015-10-28 LAB — RPR: RPR Ser Ql: REACTIVE — AB

## 2015-11-01 ENCOUNTER — Encounter (INDEPENDENT_AMBULATORY_CARE_PROVIDER_SITE_OTHER): Payer: Self-pay | Admitting: *Deleted

## 2015-11-01 VITALS — Wt 141.0 lb

## 2015-11-01 DIAGNOSIS — A539 Syphilis, unspecified: Secondary | ICD-10-CM

## 2015-11-01 DIAGNOSIS — Z006 Encounter for examination for normal comparison and control in clinical research program: Secondary | ICD-10-CM

## 2015-11-01 MED ORDER — PENICILLIN G BENZATHINE 1200000 UNIT/2ML IM SUSP
1.2000 10*6.[IU] | Freq: Once | INTRAMUSCULAR | Status: AC
  Administered 2015-11-01: 1.2 10*6.[IU] via INTRAMUSCULAR

## 2015-11-02 ENCOUNTER — Encounter: Payer: Self-pay | Admitting: *Deleted

## 2015-11-02 NOTE — Progress Notes (Addendum)
David Hoffman is here for [name], week 48. His gastroenteritis has resolved. He is to received 1 of 3 parts of treatment for his new dx of syphilis. We discussed in detail of where these STIs are coming from and how to prevent him from future episodes. He has been in a relationship with one man and this mans claims he has been treated for these things recently but failed to let Farah know. I explained that his partner could very well have been re-exposed to these STIs since he failed to "come clean" to the participant. I explained the possibility of the participant being re-exposed to these STIs and the importance of wearing condoms and abstinence during this period. He verbalized understanding. He is currently wanting out of the relationship as it is not a healthy one (emotionally and health wise). He received Bicillin for Syphilis and plans on coming in next Tuesday for 2 of 3 treatment visits. He does have dark spots on palms of his hands and soles of his feet. We talked about the process and potential peeling at these spots. DEXA scheduled on the 13th. Subject was stuck multiple times in an attempt to obtain blood. Were able to get some tubes but not all. Pill count performed. Study meds were dispensed. He received $50 gift card for the visit. Week 60 scheduled for 02/27/2016. Tacey HeapElisha Epperson RN

## 2015-11-08 ENCOUNTER — Ambulatory Visit (INDEPENDENT_AMBULATORY_CARE_PROVIDER_SITE_OTHER): Payer: Self-pay | Admitting: *Deleted

## 2015-11-08 DIAGNOSIS — A539 Syphilis, unspecified: Secondary | ICD-10-CM

## 2015-11-08 MED ORDER — PENICILLIN G BENZATHINE 1200000 UNIT/2ML IM SUSP
1.2000 10*6.[IU] | Freq: Once | INTRAMUSCULAR | Status: AC
  Administered 2015-11-08: 1.2 10*6.[IU] via INTRAMUSCULAR

## 2015-11-10 ENCOUNTER — Ambulatory Visit
Admission: RE | Admit: 2015-11-10 | Discharge: 2015-11-10 | Disposition: A | Discharge status: Home / Self Care | Payer: No Typology Code available for payment source | Source: Ambulatory Visit | Attending: Infectious Disease | Admitting: Infectious Disease

## 2015-11-10 DIAGNOSIS — Z006 Encounter for examination for normal comparison and control in clinical research program: Secondary | ICD-10-CM

## 2015-11-15 ENCOUNTER — Ambulatory Visit (INDEPENDENT_AMBULATORY_CARE_PROVIDER_SITE_OTHER): Payer: Self-pay | Admitting: *Deleted

## 2015-11-15 DIAGNOSIS — A539 Syphilis, unspecified: Secondary | ICD-10-CM

## 2015-11-15 MED ORDER — PENICILLIN G BENZATHINE 1200000 UNIT/2ML IM SUSP
1.2000 10*6.[IU] | Freq: Once | INTRAMUSCULAR | Status: AC
Start: 2015-11-15 — End: 2015-11-15
  Administered 2015-11-15: 1.2 10*6.[IU] via INTRAMUSCULAR

## 2015-11-15 MED ORDER — PENICILLIN G BENZATHINE 1200000 UNIT/2ML IM SUSP
1.2000 10*6.[IU] | Freq: Once | INTRAMUSCULAR | Status: AC
  Administered 2015-11-15: 1.2 10*6.[IU] via INTRAMUSCULAR

## 2015-11-21 ENCOUNTER — Encounter: Payer: Self-pay | Admitting: Infectious Disease

## 2015-11-21 LAB — HEPATITIS B CORE ANTIBODY, TOTAL: HEP B C TOTAL AB: NEGATIVE

## 2015-11-21 LAB — HIV-1 RNA QUANT-NO REFLEX-BLD

## 2015-11-21 LAB — HEPATITIS B SURFACE ANTIBODY,QUALITATIVE: Hep B S Ab: POSITIVE

## 2015-11-21 LAB — CD4/CD8 (T-HELPER/T-SUPPRESSOR CELL)
CD4%: 31
CD4: 932

## 2015-11-21 LAB — HEPATITIS B SURFACE ANTIGEN: Hepatitis B Surface Antigen: NEGATIVE

## 2015-11-21 LAB — HEPATITIS C ANTIBODY: HEP C AB: NEGATIVE

## 2015-12-02 ENCOUNTER — Encounter: Payer: Self-pay | Admitting: Infectious Disease

## 2015-12-05 ENCOUNTER — Encounter: Payer: Self-pay | Admitting: Infectious Disease

## 2015-12-13 ENCOUNTER — Encounter: Payer: Self-pay | Admitting: Infectious Disease

## 2016-02-27 ENCOUNTER — Other Ambulatory Visit: Payer: Self-pay | Admitting: Infectious Disease

## 2016-02-27 ENCOUNTER — Encounter (INDEPENDENT_AMBULATORY_CARE_PROVIDER_SITE_OTHER): Payer: Self-pay | Admitting: *Deleted

## 2016-02-27 DIAGNOSIS — Z006 Encounter for examination for normal comparison and control in clinical research program: Secondary | ICD-10-CM

## 2016-03-02 NOTE — Progress Notes (Signed)
Study: Phase 3, Randomized, Double-Blinded Study to evaluate the safety and efficacy of GS-9883/Emtricitabine/Tenofovir Alafenamide versus Abacavir/Dolutegravir/Lamivudine in HIV-1 Infected, Antiretroviral Treatment-Naive Adults.    David Hoffman is here for [name], week 60. Spots from syphilis have resolved. He denies any new issues. States he is remaining abstinent for now and focusing on him and his stability. Blood drawn with no problems. #46 pills remain placing him at 87% adherent. His reasoning is that he would forget to take the medicine "here and there". I stressed the importance of him taking study medication on a daily basis as this may set him up for resistance. We discussed downloading an App on his phone for a reminder or placing his medications beside something he does everyday like brushing his teeth. I also gave him a pill box so he can visually see what day he has taken a pill. He received $50 gift card and next appointment scheduled for 05/21/2016. Tacey Heap RN

## 2016-05-21 ENCOUNTER — Encounter (INDEPENDENT_AMBULATORY_CARE_PROVIDER_SITE_OTHER): Payer: Self-pay | Admitting: *Deleted

## 2016-05-21 ENCOUNTER — Encounter: Payer: Self-pay | Admitting: Infectious Disease

## 2016-05-21 ENCOUNTER — Ambulatory Visit (INDEPENDENT_AMBULATORY_CARE_PROVIDER_SITE_OTHER): Payer: Self-pay | Admitting: Infectious Disease

## 2016-05-21 ENCOUNTER — Other Ambulatory Visit: Payer: No Typology Code available for payment source

## 2016-05-21 VITALS — BP 117/73 | HR 73 | Temp 98.4°F | Wt 143.5 lb

## 2016-05-21 VITALS — BP 117/73 | HR 73 | Temp 98.4°F | Resp 16 | Wt 143.5 lb

## 2016-05-21 DIAGNOSIS — B20 Human immunodeficiency virus [HIV] disease: Secondary | ICD-10-CM

## 2016-05-21 DIAGNOSIS — M130 Polyarthritis, unspecified: Secondary | ICD-10-CM

## 2016-05-21 DIAGNOSIS — R110 Nausea: Secondary | ICD-10-CM

## 2016-05-21 DIAGNOSIS — Z23 Encounter for immunization: Secondary | ICD-10-CM

## 2016-05-21 DIAGNOSIS — A5486 Gonococcal sepsis: Secondary | ICD-10-CM

## 2016-05-21 DIAGNOSIS — R590 Localized enlarged lymph nodes: Secondary | ICD-10-CM

## 2016-05-21 DIAGNOSIS — Z006 Encounter for examination for normal comparison and control in clinical research program: Secondary | ICD-10-CM

## 2016-05-21 DIAGNOSIS — A549 Gonococcal infection, unspecified: Secondary | ICD-10-CM

## 2016-05-21 MED ORDER — ONDANSETRON 8 MG PO TBDP: 8.0000 mg | ORAL_TABLET | Freq: Three times a day (TID) | ORAL | 11 refills | Status: DC | PRN

## 2016-05-21 NOTE — Progress Notes (Signed)
Subjective:   Chief complaint: Here for follow-up visit for Gilead study.    Patient ID: David Hoffman, male    DOB: [birth year], 25 y.o.   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  His VL is Not DETECTED on assay while on study drug and with healthy CD4 count.   He agreed to STI testing today including syphilis test.  He also agreed to HPV vaccine #3 and also Flu shot but wanted to hold off on meningococcal vaccine and hep A #2 until November.  He claims that the study drugs make him nauseous causing him to need phenergan which can make him late to work at times because it can make him sleepy. I offered to change him to PRN zofran for his nausea instead.   He also brought FMLA forms for his visit which we filled out.  Past Medical History:  Diagnosis Date  . Cervical lymphadenopathy 10/26/2015  . Exudative pharyngitis 01/19/2015  . Gastroenteritis presumed infectious 10/26/2015  . Gonorrhea 01/19/2015  . History of gonorrhea   . HIV disease [facility]) march 2016 dx  . Oral lesion 10/26/2015  . 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 Tinea pedis     No past surgical history on file.  Family History  Problem Relation Age of Onset  . Diabetes Mother   . Schizophrenia Maternal Grandmother       Social History   Social History  . Marital status: Single    Spouse name: N/A  . Number of children: N/A  . Years of education: N/A   Social History Main Topics  .  Smoking status: Current Every Day Smoker    Types: Cigars    Last attempt to quit: 11/09/2014  . Smokeless tobacco: Never Used  . Alcohol use 0.0 oz/week     Comment: rare use  . Drug use:     Types: Marijuana  . Sexual activity: Yes    Partners: Male     Comment: pt declined condoms   Other Topics Concern  . Not on file   Social History Narrative  . No narrative on file    No Known Allergies   Current Outpatient Prescriptions:  .  acetaminophen (TYLENOL) 500 MG tablet, Take 1,000 mg by mouth every 6 (six) hours as needed for mild pain, moderate pain or fever., Disp: , Rfl:  .  diphenoxylate-atropine (LOMOTIL) 2.5-0.025 MG tablet, Take 1 tablet by mouth 4 (four) times daily as needed for diarrhea or loose stools., Disp: 60 tablet, Rfl: 0 .  fluconazole (DIFLUCAN) 150 MG tablet, Take 1 tablet (150 mg total) by mouth once a week. (Patient not taking: Reported on 10/26/2015), Disp: 3 tablet, Rfl: 0 .  ondansetron (ZOFRAN-ODT) 8 MG disintegrating tablet, Take 1 tablet (8 mg total) by mouth every 8 (eight) hours as needed for nausea or vomiting., Disp: 30 tablet, Rfl: 11 .  PRESCRIPTION MEDICATION, Take 2 tablets by mouth daily. Pt is a part of  a HIV study drug- GS-[phone] by Dr. Algis Liming, Disp: , Rfl:  .  research study medication, GS-9883/Emtricitabine/TAF or PLACEBO Take one tablet, by mouth, daily with or without food.   DO NOT FILL. PRODUCT PROVIDED BY STUDY., Disp: 30 each, Rfl: 11 .  research study medication, TRIUMEQ or PLACEBO Take one tablet, by mouth, daily with or without food.   DO NOT FILL. PRODUCT PROVIDED BY STUDY., Disp: 30 each, Rfl: 11 .  valACYclovir (VALTREX) 1000 MG tablet, Take 1 tablet (1,000 mg total) by mouth 2 (two) times daily. X 7 days. Can use as needed for oral ulcers (Patient not taking: Reported on 10/26/2015), Disp: 30 tablet, Rfl: 0     Review of Systems  Constitutional: Negative for activity change, appetite change, chills, diaphoresis, fatigue, fever  and unexpected weight change.  HENT: Negative for congestion, mouth sores, rhinorrhea, sinus pressure, sneezing, sore throat and trouble swallowing.   Eyes: Negative for photophobia and visual disturbance.  Respiratory: Negative for cough, chest tightness, shortness of breath, wheezing and stridor.   Cardiovascular: Negative for chest pain, palpitations and leg swelling.  Gastrointestinal: Positive for nausea. Negative for abdominal distention, abdominal pain, anal bleeding, blood in stool, constipation, diarrhea and vomiting.  Endocrine: Negative for cold intolerance, heat intolerance, polyphagia and polyuria.  Genitourinary: Negative for difficulty urinating, dysuria, flank pain and hematuria.  Musculoskeletal: Negative for arthralgias, back pain, gait problem, joint swelling and myalgias.  Skin: Negative for color change, pallor, rash and wound.  Neurological: Negative for dizziness, tremors, seizures, syncope, speech difficulty, weakness, light-headedness and numbness.  Hematological: Negative for adenopathy. Does not bruise/bleed easily.  Psychiatric/Behavioral: Negative for agitation, behavioral problems, confusion, decreased concentration, dysphoric mood and sleep disturbance.       Objective:   Physical Exam  Constitutional: He is oriented to person, place, and time. He appears well-developed and well-nourished. No distress.  HENT:  Head: Normocephalic and atraumatic.  Mouth/Throat: Uvula is midline, oropharynx is clear and moist and mucous membranes are normal. No oropharyngeal exudate, posterior oropharyngeal edema or posterior oropharyngeal erythema.    Eyes: Conjunctivae and EOM are normal. Right eye exhibits no discharge. Left eye exhibits no discharge. No scleral icterus.  Neck: Normal range of motion. Neck supple. No JVD present. No tracheal deviation present.  Cardiovascular: Normal rate and regular rhythm.  Exam reveals no friction rub.   Pulmonary/Chest: Effort normal. No  respiratory distress. He has no wheezes.  Abdominal: Soft. Bowel sounds are normal. He exhibits no distension.  Musculoskeletal: Normal range of motion. He exhibits no edema.       Right knee: He exhibits no effusion.       Left knee: He exhibits no swelling and no effusion.  Neurological: He is alert and oriented to person, place, and time. Coordination normal.  Skin: Skin is warm. No rash noted. He is not diaphoretic. No erythema. No pallor.  Psychiatric: He has a normal mood and affect. His behavior is normal. Judgment and thought content normal.          Assessment & Plan:   Nausea: try zofran instead of phenergan. Since he is taking these meds prior to going to work and taking phenergan to prevent nausea and then late to work due to sleepiness I question if there is secondary gain here. Why not take his meds at a different time of day rather than before work?    HIV: see above and continue TWO pills daily from study which is now extended to 144 weeks. 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

## 2016-05-21 NOTE — Progress Notes (Signed)
[place] is here for his week 372 Gilead study visit. He denies any new problems, but states that he still continues to have nausea after he takes his meds. He has phenergan to take but it makes him drowsy at work. He also needs his HPV # 3 and a flushot today. He will return in January for the next visit.

## 2016-05-22 ENCOUNTER — Ambulatory Visit: Payer: No Typology Code available for payment source

## 2016-05-22 LAB — CYTOLOGY, (ORAL, ANAL, URETHRAL) ANCILLARY ONLY
CHLAMYDIA, DNA PROBE: NEGATIVE
CHLAMYDIA, DNA PROBE: NEGATIVE
NEISSERIA GONORRHEA: NEGATIVE
NEISSERIA GONORRHEA: POSITIVE — AB

## 2016-05-23 ENCOUNTER — Ambulatory Visit (INDEPENDENT_AMBULATORY_CARE_PROVIDER_SITE_OTHER): Payer: Self-pay | Admitting: Licensed Clinical Social Worker

## 2016-05-23 ENCOUNTER — Encounter: Payer: Self-pay | Admitting: Infectious Disease

## 2016-05-23 ENCOUNTER — Ambulatory Visit: Payer: Self-pay

## 2016-05-23 DIAGNOSIS — A549 Gonococcal infection, unspecified: Secondary | ICD-10-CM

## 2016-05-23 MED ORDER — AZITHROMYCIN 250 MG PO TABS
1000.0000 mg | ORAL_TABLET | Freq: Once | ORAL | Status: AC
  Administered 2016-05-23: 1000 mg via ORAL

## 2016-05-23 MED ORDER — CEFTRIAXONE SODIUM 250 MG IJ SOLR
250.0000 mg | Freq: Once | INTRAMUSCULAR | Status: AC
  Administered 2016-05-23: 250 mg via INTRAMUSCULAR

## 2016-05-31 ENCOUNTER — Encounter: Payer: Self-pay | Admitting: *Deleted

## 2016-05-31 LAB — HIV-1 RNA QUANT-NO REFLEX-BLD: HIV-1 RNA Viral Load: NOT DETECTED

## 2016-05-31 LAB — CD4/CD8 (T-HELPER/T-SUPPRESSOR CELL)
CD4 absolute: 832
CD4%: 34

## 2016-06-13 ENCOUNTER — Encounter: Payer: Self-pay | Admitting: *Deleted

## 2016-06-13 LAB — CD4/CD8 (T-HELPER/T-SUPPRESSOR CELL)
CD4 ABSOLUTE: 832
CD4%: 34

## 2016-06-27 ENCOUNTER — Ambulatory Visit (INDEPENDENT_AMBULATORY_CARE_PROVIDER_SITE_OTHER): Payer: Self-pay | Admitting: *Deleted

## 2016-06-27 ENCOUNTER — Ambulatory Visit: Payer: No Typology Code available for payment source

## 2016-06-27 DIAGNOSIS — Z23 Encounter for immunization: Secondary | ICD-10-CM

## 2016-08-14 ENCOUNTER — Encounter: Payer: Self-pay | Admitting: Infectious Disease

## 2016-08-14 ENCOUNTER — Encounter (INDEPENDENT_AMBULATORY_CARE_PROVIDER_SITE_OTHER): Payer: Self-pay | Admitting: *Deleted

## 2016-08-14 VITALS — BP 113/71 | HR 66 | Temp 97.4°F | Resp 16 | Wt 143.0 lb

## 2016-08-14 DIAGNOSIS — Z006 Encounter for examination for normal comparison and control in clinical research program: Secondary | ICD-10-CM

## 2016-08-14 NOTE — Addendum Note (Signed)
Addended by: Phill MyronEPPERSON, KIMBERLY C on: 08/14/2016 08:31 AM   Modules accepted: Orders

## 2016-08-14 NOTE — Progress Notes (Signed)
David Hoffman is here for his week 84 study visit for [name]: Phase 3 Study to Evaluate the Safety and Efficacy of GS-9883/Descovy Versus Triumeq for HIV Positive Treatment Naive Individuals.  He says he is doing fine and doesn't have any problems. He had a bad sore throat over Christmas and it resolved without any treatment. He has been mostly adherent, ownly missing 4 doses out of the past 3 months. He will return in 3 months for the next visit.

## 2016-11-12 ENCOUNTER — Ambulatory Visit
Admission: RE | Admit: 2016-11-12 | Discharge: 2016-11-12 | Disposition: A | Discharge status: Home / Self Care | Payer: No Typology Code available for payment source | Source: Ambulatory Visit | Attending: Infectious Disease | Admitting: Infectious Disease

## 2016-11-12 ENCOUNTER — Ambulatory Visit: Payer: Self-pay | Admitting: Infectious Disease

## 2016-11-12 ENCOUNTER — Encounter (INDEPENDENT_AMBULATORY_CARE_PROVIDER_SITE_OTHER): Payer: Self-pay | Admitting: Infectious Disease

## 2016-11-12 VITALS — BP 130/80 | HR 66 | Temp 98.0°F | Wt 137.2 lb

## 2016-11-12 DIAGNOSIS — Z006 Encounter for examination for normal comparison and control in clinical research program: Secondary | ICD-10-CM

## 2016-11-14 NOTE — Progress Notes (Signed)
Subjective:   Chief complaint: Here for follow-up visit for Gilead study for CPE.    Patient ID: David Hoffman, male    DOB: [birth year], 26 y.o.   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   His ROS is negative.  Past Medical History:  Diagnosis Date  . Cervical lymphadenopathy 10/26/2015  . Exudative pharyngitis 01/19/2015  . Gastroenteritis presumed infectious 10/26/2015  . Gonorrhea 01/19/2015  . History of gonorrhea   . HIV disease [facility]) march 2016 dx  . Oral lesion 10/26/2015  . 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 Tinea pedis     No past surgical history on file.  Family History  Problem Relation Age of Onset  . Diabetes Mother   . Schizophrenia Maternal Grandmother       Social History   Social History  . Marital status: Single    Spouse name: N/A  . Number of children: N/A  . Years of education: N/A   Social History Main Topics  . Smoking status: Current Every Day Smoker    Types: Cigars    Last attempt to quit: 11/09/2014  . Smokeless tobacco: Never Used  . Alcohol use 0.0 oz/week     Comment: rare use  . Drug use: Yes    Types: Marijuana  . Sexual activity: Yes    Partners: Male     Comment: pt declined condoms   Other Topics Concern  . Not on file   Social History Narrative  . No narrative on file    No Known Allergies   Current Outpatient Prescriptions:  .  acetaminophen (TYLENOL) 500 MG tablet, Take 1,000 mg by mouth every 6 (six) hours as needed for mild pain, moderate pain or fever., Disp: , Rfl:  .  diphenoxylate-atropine (LOMOTIL) 2.5-0.025 MG tablet, Take 1 tablet by mouth 4 (four) times daily as needed for diarrhea or loose stools., Disp: 60 tablet, Rfl: 0 .  fluconazole (DIFLUCAN)  150 MG tablet, Take 1 tablet (150 mg total) by mouth once a week., Disp: 3 tablet, Rfl: 0 .  ondansetron (ZOFRAN-ODT) 8 MG disintegrating tablet, Take 1 tablet (8 mg total) by mouth every 8 (eight) hours as needed for nausea or vomiting., Disp: 30 tablet, Rfl: 11 .  PRESCRIPTION MEDICATION, Take 2 tablets by mouth daily. Pt is a part of a HIV study drug- [phone] by Dr. Algis Liming, Disp: , Rfl:  .  research study medication, GS-9883/Emtricitabine/TAF or PLACEBO Take one tablet, by mouth, daily with or without food.   DO NOT FILL. PRODUCT PROVIDED BY STUDY., Disp: 30 each, Rfl: 11 .  research study medication, TRIUMEQ or PLACEBO Take one tablet, by mouth, daily with or without food.   DO NOT FILL. PRODUCT PROVIDED BY STUDY., Disp: 30 each, Rfl: 11 .  valACYclovir (VALTREX) 1000 MG tablet, Take 1 tablet (1,000 mg total) by mouth 2 (two) times daily. X 7 days. Can use as needed for oral ulcers, Disp: 30 tablet, Rfl: 0     Review of Systems  Constitutional: Negative for activity change, appetite change, chills, diaphoresis, fatigue, fever and unexpected weight change.  HENT: Negative for congestion, mouth sores, rhinorrhea, sinus pressure, sneezing, sore throat and trouble swallowing.   Eyes: Negative for photophobia and visual disturbance.  Respiratory: Negative for cough, chest tightness, shortness of breath, wheezing and stridor.   Cardiovascular: Negative for chest pain, palpitations and leg swelling.  Gastrointestinal: Negative for abdominal distention, abdominal pain, anal bleeding, blood in stool, constipation, diarrhea, nausea and vomiting.  Endocrine: Negative for cold intolerance, heat intolerance, polyphagia and polyuria.  Genitourinary: Negative for difficulty urinating, dysuria, flank pain and hematuria.  Musculoskeletal: Negative for arthralgias, back pain, gait problem, joint swelling and myalgias.  Skin: Negative for color change, pallor, rash and wound.  Neurological: Negative for  dizziness, tremors, seizures, syncope, speech difficulty, weakness, light-headedness and numbness.  Hematological: Negative for adenopathy. Does not bruise/bleed easily.  Psychiatric/Behavioral: Negative for agitation, behavioral problems, confusion, decreased concentration, dysphoric mood and sleep disturbance.       Objective:   Physical Exam  Constitutional: He is oriented to person, place, and time. He appears well-developed and well-nourished. No distress.  HENT:  Head: Normocephalic and atraumatic.  Nose: Nose normal.  Mouth/Throat: Uvula is midline, oropharynx is clear and moist and mucous membranes are normal. No oropharyngeal exudate, posterior oropharyngeal edema or posterior oropharyngeal erythema.    Eyes: Conjunctivae and EOM are normal. Pupils are equal, round, and reactive to light. Right eye exhibits no discharge. Left eye exhibits no discharge. No scleral icterus.  Neck: Normal range of motion. Neck supple. No JVD present. No tracheal deviation present. No thyromegaly present.  Cardiovascular: Normal rate, regular rhythm and normal heart sounds.  Exam reveals no gallop and no friction rub.   No murmur heard. Pulmonary/Chest: Effort normal and breath sounds normal. No respiratory distress. He has no wheezes. He has no rales. He exhibits no tenderness.  Abdominal: Soft. Bowel sounds are normal. He exhibits no distension. There is no hepatosplenomegaly. There is generalized tenderness. There is no rebound, no guarding and no CVA tenderness.  Musculoskeletal: Normal range of motion. He exhibits no edema.       Right elbow: He exhibits no effusion. Tenderness found.       Left elbow: He exhibits no effusion. Tenderness found.       Right knee: He exhibits no swelling and no effusion.       Left knee: He exhibits no swelling and no effusion. No tenderness found.  Lymphadenopathy:       Head (right side): No submandibular, no preauricular and no posterior auricular adenopathy  present.       Head (left side): No submandibular, no preauricular and no posterior auricular adenopathy present.    He has no cervical adenopathy.    He has no axillary adenopathy.       Right axillary: No pectoral adenopathy present.       Left axillary: No pectoral adenopathy present.      Right: No supraclavicular adenopathy present.       Left: No supraclavicular adenopathy present.  Neurological: He is alert and oriented to person, place, and time. He has normal strength. He displays no tremor. No cranial nerve deficit or sensory deficit. He exhibits normal muscle tone. He displays no seizure activity. Coordination and gait normal. GCS eye subscore is 4. GCS verbal subscore is 5. GCS motor subscore is 6.  Skin: Skin is warm and dry. No rash noted. He is not diaphoretic. No erythema. No pallor.  Psychiatric: He has a normal mood and affect. His behavior is normal. Judgment and thought content normal.          Assessment & Plan:   CPE is normal. Labs and EKG reviewed.

## 2016-11-19 NOTE — Progress Notes (Signed)
STUDYDuane Hoffman screening visit - Phase 3, Randomized, Double-Blinded Study to evaluate the safety and efficacy of GS-9883/Emtricitabine/Tenofovir Alafenamide versus Abacavir/Dolutegravir/Lamivudine in HIV-1 Infected, Antiretroviral Treatment-Naive Adults.  David Hoffman is here for week 96. He denies any new issues. He states he has been abstinent from sexual contact. Fasting labs were drawn. He was seen by Dr. Daiva Eves for PE (see note). #5 pills of each oral study product returned making him 94% compliant. Fasting labs were drawn with no problems. EKG obtained. Oral study product dispensed. Next appointment scheduled for [id number].

## 2016-11-23 ENCOUNTER — Encounter: Payer: Self-pay | Admitting: *Deleted

## 2016-11-23 LAB — CD4/CD8 (T-HELPER/T-SUPPRESSOR CELL)
CD4 % Helper T Cell: 34.1
CD4 Count: 982

## 2016-11-23 LAB — HEPATITIS B SURFACE ANTIGEN: HEPATITIS B SURFACE ANTIGEN: NEGATIVE

## 2016-11-23 LAB — HEPATITIS C ANTIBODY: Hepatitis C Ab: NEGATIVE

## 2016-11-23 LAB — HEPATITIS B SURFACE ANTIBODY,QUALITATIVE: HEP B S AB: NEGATIVE

## 2016-11-23 LAB — HEPATITIS B CORE ANTIBODY, TOTAL: Hep B Core Total Ab: NEGATIVE

## 2016-11-23 LAB — HIV-1 RNA QUANT-NO REFLEX-BLD: HIV-1 RNA Viral Load: <20

## 2017-01-29 ENCOUNTER — Encounter (INDEPENDENT_AMBULATORY_CARE_PROVIDER_SITE_OTHER): Payer: Self-pay | Admitting: *Deleted

## 2017-01-29 VITALS — BP 112/76 | HR 68 | Temp 97.5°F | Wt 139.5 lb

## 2017-01-29 DIAGNOSIS — Z006 Encounter for examination for normal comparison and control in clinical research program: Secondary | ICD-10-CM

## 2017-01-29 LAB — HIV-1 RNA QUANT-NO REFLEX-BLD

## 2017-01-29 NOTE — Progress Notes (Signed)
David Hoffman is here for his week 108 visit for [name]. He says he is doing well, no new complaints or problems noted. His adherence was down a little, and he says he tends to forget them on the weekends. We talked about adherence strategies and he knows about the possibility of resistance developing if he doesn't take them properly. His next appt will be in September.

## 2017-02-05 IMAGING — CT CT ABD-PELV W/ CM
2 of 4 series · 17 of 46 positions shown, 19 images · IV contrast (OMNIPAQUE)
Comparison: None.

CLINICAL DATA: Abdominal pain. Disseminated gonococcal infection.
HIV disease.

EXAM:
CT ABDOMEN AND PELVIS WITH CONTRAST
TECHNIQUE: Multidetector CT imaging of the abdomen and pelvis was performed
using the standard protocol following bolus administration of
intravenous contrast.
CONTRAST:  1 OMNIPAQUE IOHEXOL 300 MG/ML SOLN, 100mL OMNIPAQUE
IOHEXOL 300 MG/ML SOLN

[Series 2: rtn a/p with · axial · 0.60mm/px · z∈[-688,-258]mm · 14 of 94 slices shown, 16 images]
[im 4/94  soft-tissue]
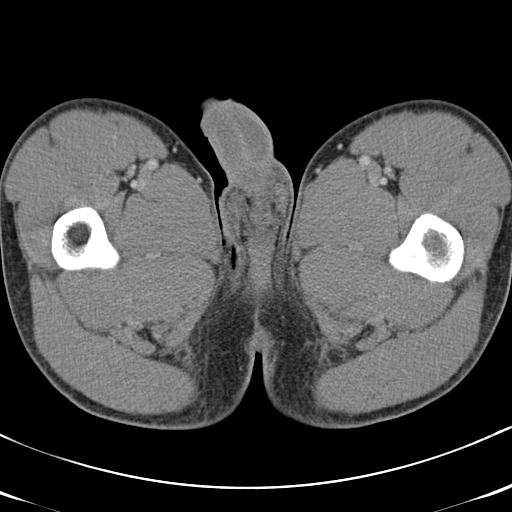
[im 4/94  bone]
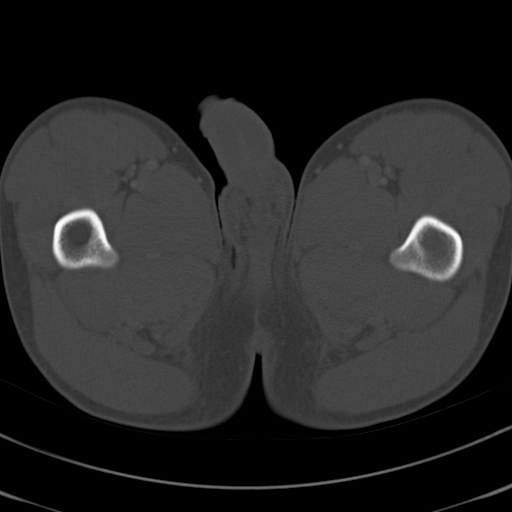
[im 11/94  soft-tissue]
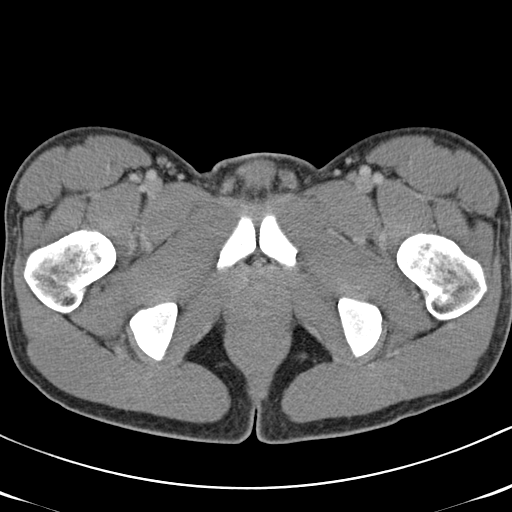
[im 18/94  soft-tissue]
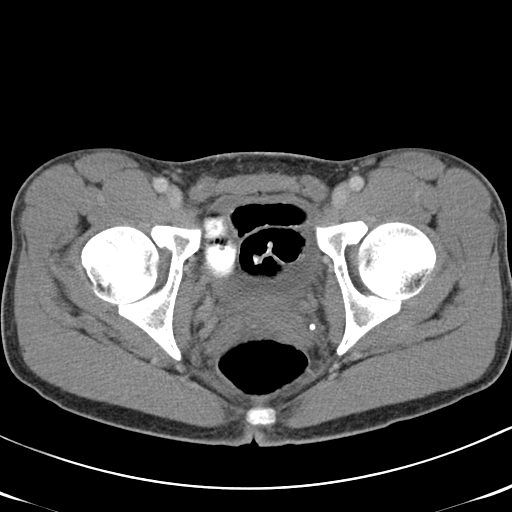
[im 26/94  soft-tissue]
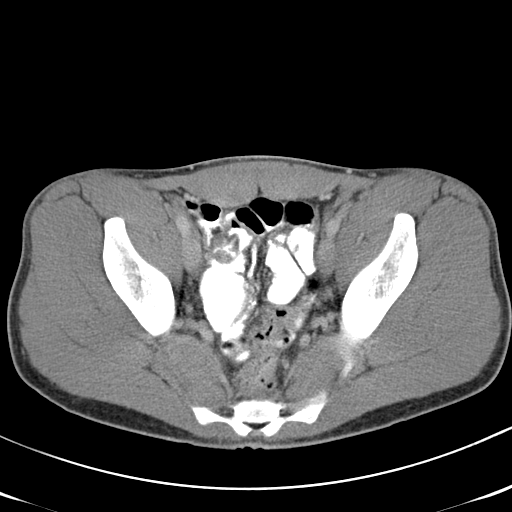
[im 33/94  soft-tissue]
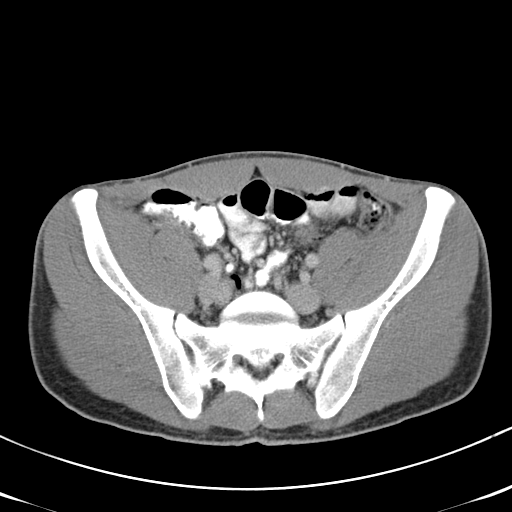
[im 36/94  soft-tissue]
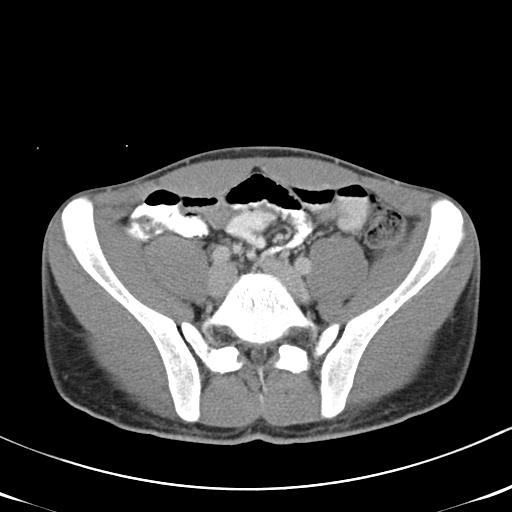
[im 43/94  soft-tissue]
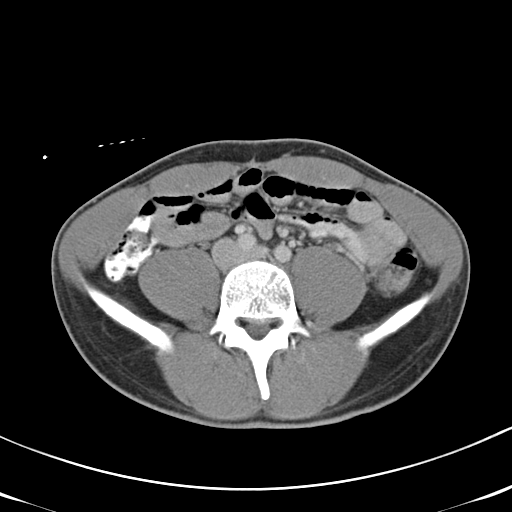
[im 51/94  soft-tissue]
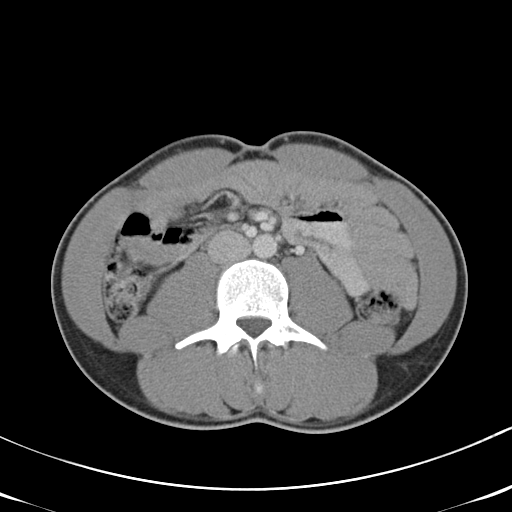
[im 58/94  soft-tissue]
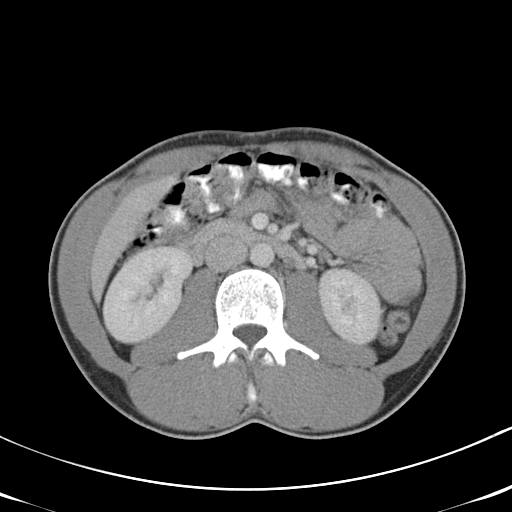
[im 58/94  bone]
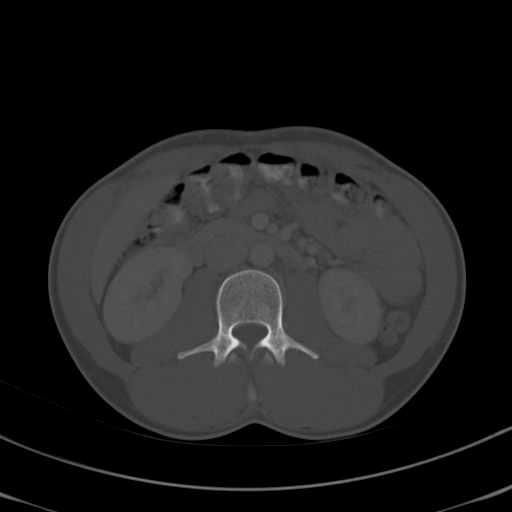
[im 61/94  soft-tissue]
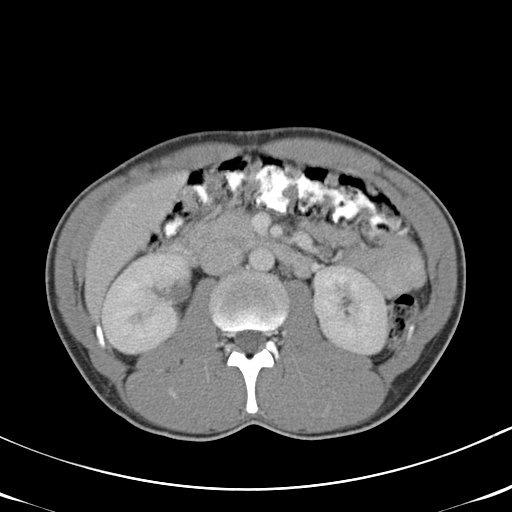
[im 68/94  soft-tissue]
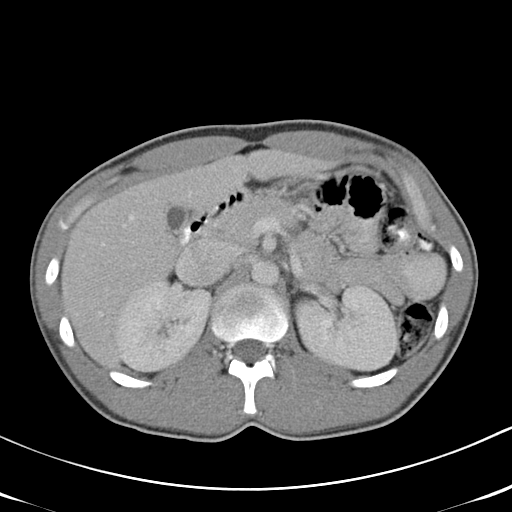
[im 76/94  soft-tissue]
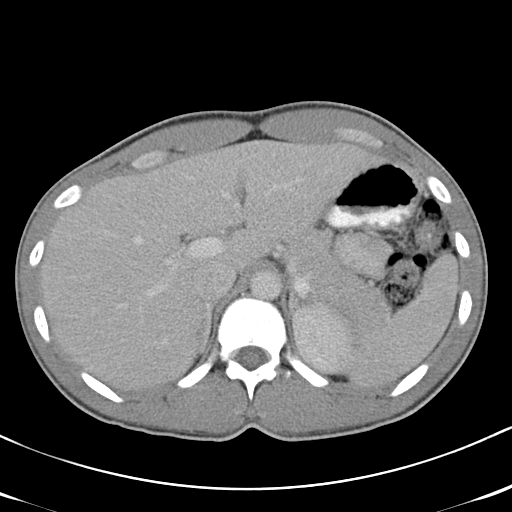
[im 83/94  soft-tissue]
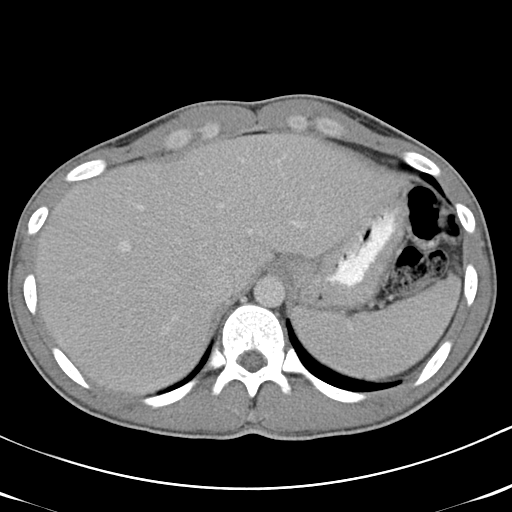
[im 90/94  soft-tissue]
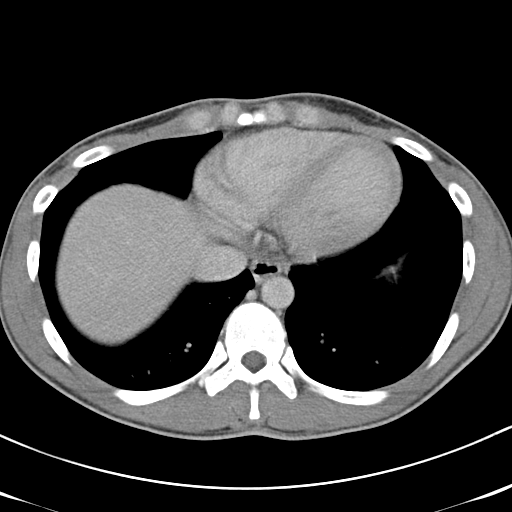

[Series 602: <mpr thick range> · coronal · 0.92mm/px · 3 of 58 slices shown]
[im 20/58  soft-tissue]
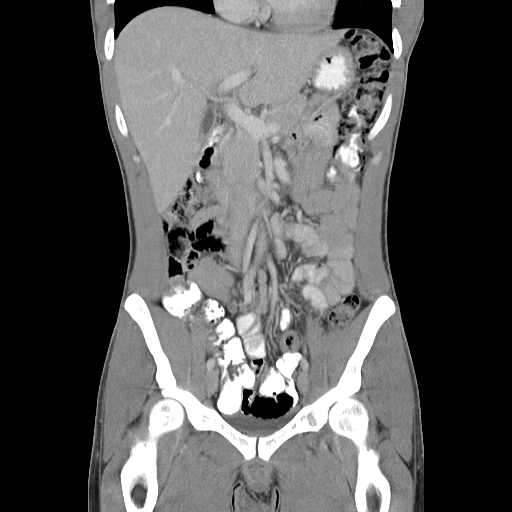
[im 26/58  soft-tissue]
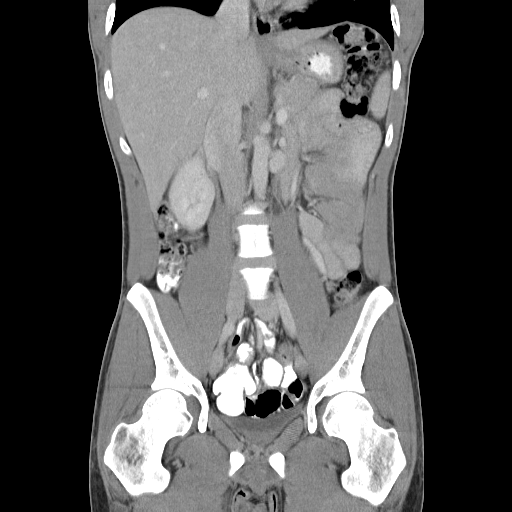
[im 32/58  soft-tissue]
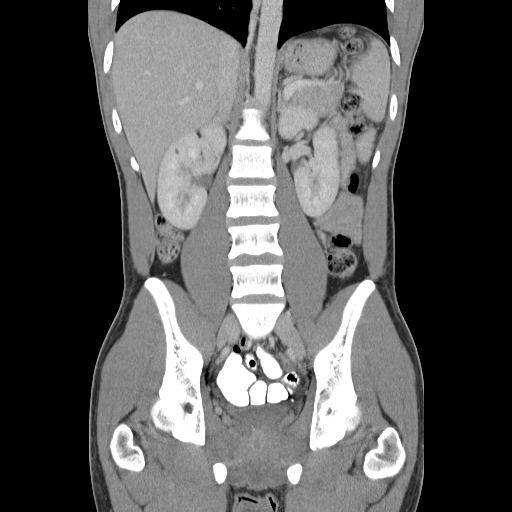

[17 of 46 positions shown; findings below may reference images not displayed]

FINDINGS: Lower chest:  Normal.

Hepatobiliary: Normal.

Pancreas: Normal.

Spleen: Normal.

Adrenals/Urinary Tract: Normal except for 4 mm cyst in the mid right
kidney.

Stomach/Bowel: Normal.

Vascular/Lymphatic: Normal.

Reproductive: Normal.

Other: No free air or free fluid.

Musculoskeletal: Prominent central disc protrusion at L4-5. Osseous
structures otherwise appear normal.
IMPRESSION: No acute abdominal or pelvic abnormality. Prominent central disc
protrusion at L4-5.

## 2017-02-27 ENCOUNTER — Encounter: Payer: Self-pay | Admitting: *Deleted

## 2017-04-23 ENCOUNTER — Encounter (INDEPENDENT_AMBULATORY_CARE_PROVIDER_SITE_OTHER): Payer: Self-pay | Admitting: *Deleted

## 2017-04-23 VITALS — BP 117/78 | HR 71 | Temp 98.3°F | Resp 16 | Wt 138.2 lb

## 2017-04-23 DIAGNOSIS — Z23 Encounter for immunization: Secondary | ICD-10-CM

## 2017-04-23 DIAGNOSIS — Z006 Encounter for examination for normal comparison and control in clinical research program: Secondary | ICD-10-CM

## 2017-04-23 NOTE — Progress Notes (Signed)
David Hoffman is here for his week 120 visit for [name]. He denies any new problems or concerns. I gave him his flushot while he was here. His adherence was 83 % and we discussed strategies for increasing that. He tends to forget when he is out of town or on the weekends when his schedule is different. He is getting ready to go to [place] for a friend's wedding and is excited about that. He is to return in December for the next study visit.

## 2017-04-26 LAB — CD4/CD8 (T-HELPER/T-SUPPRESSOR CELL)
CD4 COUNT: 1037
CD4 T CELL HELPER: 31.3

## 2017-04-29 LAB — HIV-1 RNA QUANT-NO REFLEX-BLD: HIV 1 RNA VIRAL LOAD: 26

## 2017-05-06 ENCOUNTER — Encounter: Payer: Self-pay | Admitting: *Deleted

## 2017-07-16 ENCOUNTER — Encounter (INDEPENDENT_AMBULATORY_CARE_PROVIDER_SITE_OTHER): Payer: Self-pay | Admitting: *Deleted

## 2017-07-16 VITALS — BP 113/74 | HR 74 | Temp 98.3°F | Wt 140.2 lb

## 2017-07-16 DIAGNOSIS — Z006 Encounter for examination for normal comparison and control in clinical research program: Secondary | ICD-10-CM

## 2017-07-16 NOTE — Progress Notes (Signed)
David Hoffman is here for his week 132 visit for Gilead. He denies any new problems or concerns. I did tell him that this may be the last meds we give him on study. He will be seeing the Pi at the next visit so he can get the scrip then.He will be returning in March.

## 2017-08-26 ENCOUNTER — Encounter (HOSPITAL_COMMUNITY): Payer: Self-pay | Admitting: Family Medicine

## 2017-08-26 ENCOUNTER — Other Ambulatory Visit: Payer: Self-pay

## 2017-08-26 ENCOUNTER — Emergency Department (HOSPITAL_COMMUNITY): Payer: No Typology Code available for payment source

## 2017-08-26 ENCOUNTER — Emergency Department (HOSPITAL_COMMUNITY)
Admission: EM | Admit: 2017-08-26 | Discharge: 2017-08-27 | Disposition: A | Discharge status: Home / Self Care | Payer: No Typology Code available for payment source | Attending: Emergency Medicine | Admitting: Emergency Medicine

## 2017-08-26 DIAGNOSIS — B20 Human immunodeficiency virus [HIV] disease: Secondary | ICD-10-CM | POA: Insufficient documentation

## 2017-08-26 DIAGNOSIS — F17290 Nicotine dependence, other tobacco product, uncomplicated: Secondary | ICD-10-CM | POA: Insufficient documentation

## 2017-08-26 DIAGNOSIS — J101 Influenza due to other identified influenza virus with other respiratory manifestations: Secondary | ICD-10-CM

## 2017-08-26 DIAGNOSIS — M791 Myalgia, unspecified site: Secondary | ICD-10-CM | POA: Insufficient documentation

## 2017-08-26 DIAGNOSIS — R509 Fever, unspecified: Secondary | ICD-10-CM | POA: Insufficient documentation

## 2017-08-26 DIAGNOSIS — Z79899 Other long term (current) drug therapy: Secondary | ICD-10-CM | POA: Insufficient documentation

## 2017-08-26 DIAGNOSIS — R5383 Other fatigue: Secondary | ICD-10-CM | POA: Insufficient documentation

## 2017-08-26 DIAGNOSIS — R51 Headache: Secondary | ICD-10-CM | POA: Insufficient documentation

## 2017-08-26 LAB — INFLUENZA PANEL BY PCR (TYPE A & B)
INFLAPCR: POSITIVE — AB
INFLBPCR: NEGATIVE

## 2017-08-26 MED ORDER — ACETAMINOPHEN 325 MG PO TABS
650.0000 mg | ORAL_TABLET | Freq: Once | ORAL | Status: AC | PRN
  Administered 2017-08-26: 650 mg via ORAL
  Filled 2017-08-26: qty 2

## 2017-08-26 MED ORDER — IBUPROFEN 200 MG PO TABS
600.0000 mg | ORAL_TABLET | Freq: Once | ORAL | Status: AC
  Administered 2017-08-27: 600 mg via ORAL
  Filled 2017-08-26: qty 3

## 2017-08-26 MED ORDER — SODIUM CHLORIDE 0.9 % IV BOLUS (SEPSIS)
1000.0000 mL | Freq: Once | INTRAVENOUS | Status: AC
  Administered 2017-08-27: 1000 mL via INTRAVENOUS

## 2017-08-26 NOTE — ED Provider Notes (Signed)
[facility]-EMERGENCY DEPT Provider Note   CSN: [id number] Arrival date & time: 08/26/17  2010     History   Chief Complaint Chief Complaint  Patient presents with  . Cough  . Fatigue  . Generalized Body Aches    HPI David Hoffman is a 27 y.o. male.  HPI  This is a 27 year old male with history of HIV who presents with fever, body aches, congestion.  Patient reports onset of symptoms on Sunday.  He reports initially a dry cough that turned "wet."  Reports diffuse myalgias.  Did not take his temperature at home but felt chilled.  Temperature here 101.7.  Denies any chest pain, abdominal pain, nausea, vomiting, diarrhea.  He is on antiretrovirals.  Past Medical History:  Diagnosis Date  . Cervical lymphadenopathy 10/26/2015  . Exudative pharyngitis 01/19/2015  . Gastroenteritis presumed infectious 10/26/2015  . Gonorrhea 01/19/2015  . History of gonorrhea   . HIV disease [facility]) march 2016 dx  . Oral lesion 10/26/2015  . 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 Tinea pedis     Patient Active Problem List   Diagnosis Date Noted  . Gastroenteritis presumed infectious 10/26/2015  . Oral lesion 10/26/2015  . Cervical lymphadenopathy 10/26/2015  . Cervical lymphadenitis   . Headache above the eye region   . Exudative pharyngitis 01/19/2015  . 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 Gonorrhea 01/19/2015  . 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 Polyarthritis 01/19/2015  . HIV disease (HCC) 11/30/2014  . Nausea without vomiting 11/30/2014    History reviewed. No pertinent surgical history.     Home Medications    Prior to Admission medications   Medication Sig Start Date End Date Taking? Authorizing Provider  acetaminophen (TYLENOL) 500 MG tablet Take 1,000 mg by mouth every 6 (six) hours as needed for mild pain, moderate pain or fever.    [provider]  diphenoxylate-atropine (LOMOTIL) 2.5-0.025 MG tablet Take 1 tablet  by mouth 4 (four) times daily as needed for diarrhea or loose stools. 10/26/15   Randall Hiss, MD  fluconazole (DIFLUCAN) 150 MG tablet Take 1 tablet (150 mg total) by mouth once a week. 09/12/15   Ginnie Smart, MD  ibuprofen (ADVIL,MOTRIN) 600 MG tablet Take 1 tablet (600 mg total) by mouth every 6 (six) hours as needed. 08/27/17   Mariem Skolnick, Mayer Masker, MD  ondansetron (ZOFRAN-ODT) 8 MG disintegrating tablet Take 1 tablet (8 mg total) by mouth every 8 (eight) hours as needed for nausea or vomiting. 05/21/16   Randall Hiss, MD  oseltamivir (TAMIFLU) 75 MG capsule Take 1 capsule (75 mg total) by mouth every 12 (twelve) hours. 08/27/17   Kieran Nachtigal, Mayer Masker, MD  PRESCRIPTION MEDICATION Take 2 tablets by mouth daily. Pt is a part of a HIV study drug- [phone] by Dr. Algis Liming    [provider]  research study medication GS-9883/Emtricitabine/TAF or PLACEBO Take one tablet, by mouth, daily with or without food.   DO NOT FILL. PRODUCT PROVIDED BY STUDY. 01/04/15   Daiva Eves, Lisette Grinder, MD  research study medication TRIUMEQ or PLACEBO Take one tablet, by mouth, daily with or without food.   DO NOT FILL. PRODUCT PROVIDED BY STUDY. 01/04/15   Daiva Eves, Lisette Grinder, MD  valACYclovir (VALTREX) 1000 MG tablet Take 1 tablet (1,000 mg total) by mouth 2 (two) times daily. X 7 days. Can use as needed for oral ulcers 02/15/15   Judyann Munson, MD    Family History Family  History  Problem Relation Age of Onset  . Diabetes Mother   . Schizophrenia Maternal Grandmother     Social History Social History   Tobacco Use  . Smoking status: Current Every Day Smoker    Types: Cigars    Last attempt to quit: 11/09/2014    Years since quitting: 2.8  . Smokeless tobacco: Never Used  Substance Use Topics  . Alcohol use: Yes    Alcohol/week: 0.0 oz    Comment: rare use  . Drug use: Yes    Types: Marijuana     Allergies   Patient has no known allergies.   Review of Systems Review  of Systems  Constitutional: Positive for chills and fever.  HENT: Positive for congestion.   Respiratory: Positive for cough. Negative for shortness of breath.   Cardiovascular: Negative for chest pain.  Gastrointestinal: Negative for abdominal pain, nausea and vomiting.  Musculoskeletal: Negative for neck pain and neck stiffness.  Neurological: Positive for headaches.  All other systems reviewed and are negative.    Physical Exam Updated Vital Signs BP (!) 113/46   Pulse 95   Temp (!) 100.7 F (38.2 C) (Oral)   Resp 20   Ht 5\' 6"  (1.676 m)   Wt 65.8 kg (145 lb)   SpO2 91%   BMI 23.40 kg/m   Physical Exam  Constitutional: He is oriented to person, place, and time. He appears well-developed and well-nourished.  HENT:  Head: Normocephalic and atraumatic.  Mouth/Throat: Oropharynx is clear and moist. No oropharyngeal exudate.  Mucous membranes dry  Eyes: Pupils are equal, round, and reactive to light.  Neck: Normal range of motion.  No meningismus  Cardiovascular: Normal rate, regular rhythm and normal heart sounds.  No murmur heard. Pulmonary/Chest: Effort normal and breath sounds normal. No respiratory distress. He has no wheezes.  Abdominal: Soft. Bowel sounds are normal. There is no tenderness. There is no rebound.  Musculoskeletal: He exhibits no edema.  Lymphadenopathy:    He has no cervical adenopathy.  Neurological: He is alert and oriented to person, place, and time.  Skin: Skin is warm and dry.  Psychiatric: He has a normal mood and affect.  Nursing note and vitals reviewed.    ED Treatments / Results  Labs (all labs ordered are listed, but only abnormal results are displayed) Labs Reviewed  INFLUENZA PANEL BY PCR (TYPE A & B) - Abnormal; Notable for the following components:      Result Value   Influenza A By PCR POSITIVE (*)    All other components within normal limits  CBC WITH DIFFERENTIAL/PLATELET - Abnormal; Notable for the following components:    Monocytes Absolute 1.1 (*)    All other components within normal limits  COMPREHENSIVE METABOLIC PANEL    EKG  EKG Interpretation None       Radiology Dg Chest 2 View  Result Date: 08/26/2017 CLINICAL DATA:  Fever, head and chest congestion,flu symptoms since 01/26 EXAM: CHEST  2 VIEW COMPARISON:  None. FINDINGS: The heart size and mediastinal contours are within normal limits. Both lungs are clear. The visualized skeletal structures are unremarkable. IMPRESSION: No active cardiopulmonary disease.  No evidence of pneumonia. Electronically Signed   By: Bary RichardStan  Maynard M.D.   On: 08/26/2017 21:23    Procedures Procedures (including critical care time)  Medications Ordered in ED Medications  acetaminophen (TYLENOL) tablet 650 mg (650 mg Oral Given 08/26/17 2129)  sodium chloride 0.9 % bolus 1,000 mL (1,000 mLs Intravenous New Bag/Given 08/27/17  1610)  ibuprofen (ADVIL,MOTRIN) tablet 600 mg (600 mg Oral Given 08/27/17 0101)     Initial Impression / Assessment and Plan / ED Course  I have reviewed the triage vital signs and the nursing notes.  Pertinent labs & imaging results that were available during my care of the patient were reviewed by me and considered in my medical decision making (see chart for details).     Patient presents with body aches, cough, upper respiratory symptoms.  Chest x-ray reviewed from triage and reassuring.  He was febrile.  Initial blood pressure 90 systolic.  He is thin and unclear whether this is his baseline.  Given HIV status, will obtain baseline lab work.  Influenza screen is positive.  Discussed with patient initiating Tamiflu.  Risk and benefits discussed.  Will defer to patient.  Lab work is reassuring.  Patient was given fluids and repeat blood pressure 113/46.  He states he feels about the same but no worse.  Recommend hydration and supportive measures at home.  After history, exam, and medical workup I feel the patient has been appropriately  medically screened and is safe for discharge home. Pertinent diagnoses were discussed with the patient. Patient was given return precautions.   Final Clinical Impressions(s) / ED Diagnoses   Final diagnoses:  Influenza A    ED Discharge Orders        Ordered    ibuprofen (ADVIL,MOTRIN) 600 MG tablet  Every 6 hours PRN     08/27/17 0141    oseltamivir (TAMIFLU) 75 MG capsule  Every 12 hours     08/27/17 0141       Zoeie Ritter, Mayer Masker, MD 08/27/17 [id number]

## 2017-08-26 NOTE — ED Triage Notes (Signed)
Patient presents with cough, nasal congestion and drainage, headache, generalized body aches, and lethargy, starting 3 days ago. 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 The patient reports he last took Mucinex, Tylenol, and cough syrup at 1500 today.

## 2017-08-27 LAB — COMPREHENSIVE METABOLIC PANEL
ALK PHOS: 60 U/L (ref 38–126)
ALT: 19 U/L (ref 17–63)
AST: 22 U/L (ref 15–41)
Albumin: 4.3 g/dL (ref 3.5–5.0)
Anion gap: 8 (ref 5–15)
BUN: 8 mg/dL (ref 6–20)
CALCIUM: 9 mg/dL (ref 8.9–10.3)
CHLORIDE: 104 mmol/L (ref 101–111)
CO2: 23 mmol/L (ref 22–32)
CREATININE: 0.99 mg/dL (ref 0.61–1.24)
GFR calc non Af Amer: >60 mL/min (ref >60)
Glucose, Bld: 95 mg/dL (ref 65–99)
Potassium: 3.6 mmol/L (ref 3.5–5.1)
Sodium: 135 mmol/L (ref 135–145)
Total Bilirubin: 0.4 mg/dL (ref 0.3–1.2)
Total Protein: 7.5 g/dL (ref 6.5–8.1)

## 2017-08-27 LAB — CBC WITH DIFFERENTIAL/PLATELET
Basophils Absolute: 0 10*3/uL (ref 0.0–0.1)
Basophils Relative: 0 %
EOS PCT: 0 %
Eosinophils Absolute: 0 10*3/uL (ref 0.0–0.7)
HCT: 44 % (ref 39.0–52.0)
Hemoglobin: 15.1 g/dL (ref 13.0–17.0)
LYMPHS PCT: 17 %
Lymphs Abs: 1.4 10*3/uL (ref 0.7–4.0)
MCH: 29.5 pg (ref 26.0–34.0)
MCHC: 34.3 g/dL (ref 30.0–36.0)
MCV: 86.1 fL (ref 78.0–100.0)
MONOS PCT: 14 %
Monocytes Absolute: 1.1 10*3/uL — ABNORMAL HIGH (ref 0.1–1.0)
Neutro Abs: 5.5 10*3/uL (ref 1.7–7.7)
Neutrophils Relative %: 69 %
PLATELETS: 254 10*3/uL (ref 150–400)
RBC: 5.11 MIL/uL (ref 4.22–5.81)
RDW: 12.6 % (ref 11.5–15.5)
WBC: 8 10*3/uL (ref 4.0–10.5)

## 2017-08-27 MED ORDER — OSELTAMIVIR PHOSPHATE 75 MG PO CAPS: 75.0000 mg | ORAL_CAPSULE | Freq: Two times a day (BID) | ORAL | 0 refills | Status: DC

## 2017-08-27 MED ORDER — IBUPROFEN 600 MG PO TABS: 600.0000 mg | ORAL_TABLET | Freq: Four times a day (QID) | ORAL | 0 refills | Status: DC | PRN

## 2017-08-27 NOTE — Discharge Instructions (Signed)
You were seen today and found to have influenza.  Make sure to stay hydrated.  Take ibuprofen or Tylenol as needed for body aches, pains, fever.  If you develop any new or worsening symptoms you should be reevaluated.

## 2017-10-14 ENCOUNTER — Encounter: Payer: No Typology Code available for payment source | Admitting: *Deleted

## 2017-10-14 ENCOUNTER — Ambulatory Visit: Payer: No Typology Code available for payment source | Admitting: Infectious Disease

## 2017-10-15 ENCOUNTER — Encounter: Payer: Self-pay | Admitting: Infectious Disease

## 2017-10-15 ENCOUNTER — Encounter (INDEPENDENT_AMBULATORY_CARE_PROVIDER_SITE_OTHER): Payer: Self-pay | Admitting: Infectious Disease

## 2017-10-15 VITALS — BP 121/79 | HR 78 | Temp 98.6°F | Wt 134.0 lb

## 2017-10-15 DIAGNOSIS — Z006 Encounter for examination for normal comparison and control in clinical research program: Secondary | ICD-10-CM

## 2017-10-15 NOTE — Progress Notes (Signed)
Subjective:   Chief complaint: Here for follow-up visit for Gilead study for CPE.    Patient ID: David Hoffman, male    DOB: [birth year], 27 y.o.   MRN: [id number]  HPI   27 year old HIV + man  enrolled into [place] study 1489 which is GS 1883 (Integrase strand transfer inhibitor, emtricitabine, tenofovir alaflenamide, vs TRIUMEQ (it is double blinded study so he takes two pills every day)   He likes the meds he is on --of course he does not know to which one he is randomized.  Past Medical History:  Diagnosis Date  . Cervical lymphadenopathy 10/26/2015  . Exudative pharyngitis 01/19/2015  . Gastroenteritis presumed infectious 10/26/2015  . Gonorrhea 01/19/2015  . History of gonorrhea   . HIV disease [facility]) march 2016 dx  . Oral lesion 10/26/2015  . 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 Tinea pedis     No past surgical history on file.  Family History  Problem Relation Age of Onset  . Diabetes Mother   . Schizophrenia Maternal Grandmother       Social History   Socioeconomic History  . Marital status: Single    Spouse name: Not on file  . Number of children: Not on file  . Years of education: Not on file  . Highest education level: Not on file  Social Needs  . Financial resource strain: Not on file  . Food insecurity - worry: Not on file  . Food insecurity - inability: Not on file  . Transportation needs - medical: Not on file  . Transportation needs - non-medical: Not on file  Occupational History  . Not on file  Tobacco Use  . Smoking status: Current Every Day Smoker    Types: Cigars    Last attempt to quit: 11/09/2014    Years since quitting: 2.9  . Smokeless tobacco: Never Used  Substance and Sexual Activity  . Alcohol use: Yes    Alcohol/week: 0.0 oz    Comment: rare use  . Drug use: Yes    Types: Marijuana  . Sexual activity: Yes    Partners: Male    Comment: pt declined condoms  Other Topics Concern  . Not on file  Social History Narrative  .  Not on file    No Known Allergies   Current Outpatient Medications:  .  acetaminophen (TYLENOL) 500 MG tablet, Take 1,000 mg by mouth every 6 (six) hours as needed for mild pain, moderate pain or fever., Disp: , Rfl:  .  diphenoxylate-atropine (LOMOTIL) 2.5-0.025 MG tablet, Take 1 tablet by mouth 4 (four) times daily as needed for diarrhea or loose stools., Disp: 60 tablet, Rfl: 0 .  fluconazole (DIFLUCAN) 150 MG tablet, Take 1 tablet (150 mg total) by mouth once a week., Disp: 3 tablet, Rfl: 0 .  ibuprofen (ADVIL,MOTRIN) 600 MG tablet, Take 1 tablet (600 mg total) by mouth every 6 (six) hours as needed., Disp: 30 tablet, Rfl: 0 .  ondansetron (ZOFRAN-ODT) 8 MG disintegrating tablet, Take 1 tablet (8 mg total) by mouth every 8 (eight) hours as needed for nausea or vomiting., Disp: 30 tablet, Rfl: 11 .  oseltamivir (TAMIFLU) 75 MG capsule, Take 1 capsule (75 mg total) by mouth every 12 (twelve) hours., Disp: 10 capsule, Rfl: 0 .  PRESCRIPTION MEDICATION, Take 2 tablets by mouth daily. Pt is a part of a HIV study drug- GS-[phone] by Dr. Algis Liming, Disp: , Rfl:  .  research study medication, GS-9883/Emtricitabine/TAF or PLACEBO Take one  tablet, by mouth, daily with or without food.   DO NOT FILL. PRODUCT PROVIDED BY STUDY., Disp: 30 each, Rfl: 11 .  research study medication, TRIUMEQ or PLACEBO Take one tablet, by mouth, daily with or without food.   DO NOT FILL. PRODUCT PROVIDED BY STUDY., Disp: 30 each, Rfl: 11 .  valACYclovir (VALTREX) 1000 MG tablet, Take 1 tablet (1,000 mg total) by mouth 2 (two) times daily. X 7 days. Can use as needed for oral ulcers, Disp: 30 tablet, Rfl: 0     Review of Systems  Constitutional: Negative for activity change, appetite change, chills, diaphoresis, fatigue, fever and unexpected weight change.  HENT: Negative for congestion, mouth sores, rhinorrhea, sinus pressure, sneezing, sore throat and trouble swallowing.   Eyes: Negative for photophobia and visual  disturbance.  Respiratory: Negative for cough, chest tightness, shortness of breath, wheezing and stridor.   Cardiovascular: Negative for chest pain, palpitations and leg swelling.  Gastrointestinal: Negative for abdominal distention, abdominal pain, anal bleeding, blood in stool, constipation, diarrhea, nausea and vomiting.  Endocrine: Negative for cold intolerance, heat intolerance, polyphagia and polyuria.  Genitourinary: Negative for difficulty urinating, dysuria, flank pain and hematuria.  Musculoskeletal: Negative for arthralgias, back pain, gait problem, joint swelling and myalgias.  Skin: Negative for color change, pallor, rash and wound.  Neurological: Negative for dizziness, tremors, seizures, syncope, speech difficulty, weakness, light-headedness and numbness.  Hematological: Negative for adenopathy. Does not bruise/bleed easily.  Psychiatric/Behavioral: Negative for agitation, behavioral problems, confusion, decreased concentration, dysphoric mood and sleep disturbance.       Objective:   Physical Exam  Constitutional: He is oriented to person, place, and time. He appears well-developed and well-nourished. No distress.  HENT:  Head: Normocephalic and atraumatic.  Nose: Nose normal.  Mouth/Throat: Uvula is midline, oropharynx is clear and moist and mucous membranes are normal. No oropharyngeal exudate, posterior oropharyngeal edema or posterior oropharyngeal erythema.    Eyes: Conjunctivae and EOM are normal. Pupils are equal, round, and reactive to light. Right eye exhibits no discharge. Left eye exhibits no discharge. No scleral icterus.  Neck: Normal range of motion. Neck supple. No JVD present. No tracheal deviation present. No thyromegaly present.  Cardiovascular: Normal rate, regular rhythm and normal heart sounds. Exam reveals no gallop and no friction rub.  No murmur heard. Pulmonary/Chest: Effort normal and breath sounds normal. No respiratory distress. He has no  wheezes. He has no rales. He exhibits no tenderness.  Abdominal: Soft. Bowel sounds are normal. He exhibits no distension. There is no hepatosplenomegaly. There is no tenderness. There is no rebound, no guarding and no CVA tenderness.  Musculoskeletal: Normal range of motion. He exhibits no edema.       Right elbow: He exhibits no effusion. No tenderness found.       Left elbow: He exhibits no effusion. No tenderness found.       Right knee: He exhibits no swelling and no effusion.       Left knee: He exhibits no swelling and no effusion. No tenderness found.  Lymphadenopathy:       Head (right side): No submandibular, no preauricular and no posterior auricular adenopathy present.       Head (left side): No submandibular, no preauricular and no posterior auricular adenopathy present.    He has no cervical adenopathy.    He has no axillary adenopathy.       Right axillary: No pectoral adenopathy present.       Left axillary: No pectoral adenopathy  present.      Right: No supraclavicular adenopathy present.       Left: No supraclavicular adenopathy present.  Neurological: He is alert and oriented to person, place, and time. He has normal strength. He displays no tremor. No cranial nerve deficit or sensory deficit. He exhibits normal muscle tone. He displays no seizure activity. Coordination and gait normal. GCS eye subscore is 4. GCS verbal subscore is 5. GCS motor subscore is 6.  Skin: Skin is warm and dry. No rash noted. He is not diaphoretic. No erythema. No pallor.  Tattoos  Psychiatric: He has a normal mood and affect. His behavior is normal. Judgment and thought content normal.   [place] 10/15/17:           Assessment & Plan:   CPE is normal. Labs and EKG w NSR and j point elevation which is unchanged.  He will enroll into HMAP and then we will go with BIKTARVY once he is out of the study

## 2017-10-18 ENCOUNTER — Encounter: Payer: Self-pay | Admitting: Infectious Disease

## 2017-10-18 ENCOUNTER — Ambulatory Visit: Payer: Self-pay

## 2017-10-18 ENCOUNTER — Encounter (INDEPENDENT_AMBULATORY_CARE_PROVIDER_SITE_OTHER): Payer: Self-pay | Admitting: *Deleted

## 2017-10-18 VITALS — BP 108/72 | HR 69 | Temp 98.0°F | Wt 143.5 lb

## 2017-10-18 DIAGNOSIS — Z006 Encounter for examination for normal comparison and control in clinical research program: Secondary | ICD-10-CM

## 2017-10-18 NOTE — Progress Notes (Signed)
David Hoffman is here for his week 144 visit for Gilead. He had missed the prior appt so this one is a little late. He did come in and see Dr. Daiva EvesVan Dam and get his EKG 3 days ago, but  we were unable to get the labs done that day since it was too late in the day. He denies any current problems. He had the flu end of January and was sick for about 2 weeks. He did not take any Tamiflu, just theraflu and mucinex. His adherence was 87% and we talked about strategies to remember his meds. His work schedule changes and he had moved in [place] that all contributed to him forgetting to take his meds though he cannot remember missing any specifically. He will be returning in June for the next study visit.

## 2017-11-04 ENCOUNTER — Encounter: Payer: Self-pay | Admitting: *Deleted

## 2017-11-04 LAB — HEPATITIS B SURFACE ANTIBODY,QUALITATIVE
HEP B C TOTAL AB: NEGATIVE
Hep B S Ab: POSITIVE
Hepatitis B Surface Antigen: NEGATIVE
Hepatitis C Ab: NEGATIVE

## 2017-11-04 LAB — CD4/CD8 (T-HELPER/T-SUPPRESSOR CELL)
CD4 Count: 1113
CD4 T CELL HELPER: 33.2

## 2017-11-04 LAB — HIV-1 RNA QUANT-NO REFLEX-BLD

## 2017-11-11 ENCOUNTER — Other Ambulatory Visit: Payer: Self-pay | Admitting: *Deleted

## 2017-11-11 DIAGNOSIS — Z006 Encounter for examination for normal comparison and control in clinical research program: Secondary | ICD-10-CM

## 2017-11-14 ENCOUNTER — Other Ambulatory Visit: Payer: No Typology Code available for payment source

## 2017-11-20 ENCOUNTER — Ambulatory Visit
Admission: RE | Admit: 2017-11-20 | Discharge: 2017-11-20 | Disposition: A | Discharge status: Home / Self Care | Payer: No Typology Code available for payment source | Source: Ambulatory Visit | Attending: Infectious Disease | Admitting: Infectious Disease

## 2017-11-20 DIAGNOSIS — Z006 Encounter for examination for normal comparison and control in clinical research program: Secondary | ICD-10-CM

## 2017-12-26 ENCOUNTER — Encounter (INDEPENDENT_AMBULATORY_CARE_PROVIDER_SITE_OTHER): Payer: Self-pay | Admitting: *Deleted

## 2017-12-26 VITALS — BP 113/71 | HR 73 | Temp 98.2°F | Wt 149.8 lb

## 2017-12-26 DIAGNOSIS — Z006 Encounter for examination for normal comparison and control in clinical research program: Secondary | ICD-10-CM

## 2017-12-26 NOTE — Progress Notes (Signed)
David Hoffman was here for his End of Blinded Treatment visit for [name]. He denies any new problems or medications. His pill count was off, he actually brought back less than he should have. He does not remember taking anymore than he should have and does not use a pill box. He usually takes them at bedtime. He said it was possible he could have put some another place and forgot about it. We reinformced adherence and keeping up with the pills. He did switch to open label Biktarvy at this visit and will start today. He will be returning in 12 weeks.

## 2018-01-03 ENCOUNTER — Encounter: Payer: Self-pay | Admitting: *Deleted

## 2018-01-03 LAB — HIV-1 RNA QUANT-NO REFLEX-BLD
CD4 Count: 1081
CD4%: 38.8
HIV-1 RNA Viral Load: 149
HIV-1 RNA Viral Load: 85

## 2018-01-09 ENCOUNTER — Encounter (INDEPENDENT_AMBULATORY_CARE_PROVIDER_SITE_OTHER): Payer: Self-pay | Admitting: *Deleted

## 2018-01-09 DIAGNOSIS — Z006 Encounter for examination for normal comparison and control in clinical research program: Secondary | ICD-10-CM

## 2018-01-09 NOTE — Progress Notes (Signed)
David Hoffman was here to get a retest of his viral load. His last VL was 85/149 and was required to be retested by the study. He says he finds taking the 1 pill a day much easier and doesn't notice any side effects like the other meds had. He did find 7 doses of his old meds and will return them at the next visit.

## 2018-02-07 ENCOUNTER — Encounter (INDEPENDENT_AMBULATORY_CARE_PROVIDER_SITE_OTHER): Payer: Self-pay | Admitting: *Deleted

## 2018-02-07 DIAGNOSIS — Z006 Encounter for examination for normal comparison and control in clinical research program: Secondary | ICD-10-CM

## 2018-02-07 NOTE — Progress Notes (Signed)
Auther is here for another lab draw for a retest of his viral load. After no response from [facility] with a previous result, they told us that we had sent the wrong tubes. We had sent genotype samples only, so we had to repeat the test. Confirmed with them which tubes to get, so hopefully it will be right this time. He will be back on August 22nd for the next study visit.

## 2018-02-28 ENCOUNTER — Encounter: Payer: Self-pay | Admitting: *Deleted

## 2018-02-28 LAB — HIV-1 RNA QUANT-NO REFLEX-BLD: HIV 1 RNA VIRAL LOAD: NOT DETECTED

## 2018-03-20 ENCOUNTER — Encounter (INDEPENDENT_AMBULATORY_CARE_PROVIDER_SITE_OTHER): Payer: Self-pay | Admitting: *Deleted

## 2018-03-20 ENCOUNTER — Encounter: Payer: Self-pay | Admitting: Infectious Disease

## 2018-03-20 VITALS — BP 131/73 | HR 66 | Temp 98.0°F | Wt 149.2 lb

## 2018-03-20 DIAGNOSIS — Z006 Encounter for examination for normal comparison and control in clinical research program: Secondary | ICD-10-CM

## 2018-03-20 NOTE — Progress Notes (Signed)
David Hoffman is here for his week 12 OL Gilead study visit. He said that the new medications have had no side effects and he actually feels better .He said he had GI fly or gastroenteritis from 6/27 until 6/30 and did not take his medications due to nausea/vomiting during those days and also missed his pills 2 other days in July when he went out of town. His adherence was 85 % when calculated for the other days which means he still missed 12 other days over the past 3 months. We did discuss adherence strategies. He is going to start keeping a calendar and check it off each time he takes a pill to keep better track of the meds.  He will be returning in November for the next visit.

## 2018-03-21 ENCOUNTER — Encounter: Payer: Self-pay | Admitting: Infectious Disease

## 2018-04-02 ENCOUNTER — Encounter: Payer: Self-pay | Admitting: *Deleted

## 2018-04-02 LAB — HIV-1 RNA QUANT-NO REFLEX-BLD
CD4 COUNT: 1000
CD4%: 31.3
HIV-1 RNA Viral Load: 185

## 2018-04-07 ENCOUNTER — Other Ambulatory Visit: Payer: Self-pay | Admitting: *Deleted

## 2018-04-07 ENCOUNTER — Encounter (INDEPENDENT_AMBULATORY_CARE_PROVIDER_SITE_OTHER): Payer: Self-pay | Admitting: *Deleted

## 2018-04-07 DIAGNOSIS — Z006 Encounter for examination for normal comparison and control in clinical research program: Secondary | ICD-10-CM

## 2018-04-07 LAB — HIV-1 RNA QUANT-NO REFLEX-BLD: HIV-1 RNA Viral Load: 32

## 2018-04-07 NOTE — Progress Notes (Signed)
David Hoffman came in today to get a redraw of blood for a viral load. His last one was a little high. He said he has been using a calendar to mark off the days when he takes his meds and that has been helping him keep on track and he has been doing much better with them. He will be returning in November.

## 2018-04-18 ENCOUNTER — Encounter: Payer: Self-pay | Admitting: *Deleted

## 2018-06-17 ENCOUNTER — Other Ambulatory Visit: Payer: Self-pay | Admitting: *Deleted

## 2018-06-17 ENCOUNTER — Encounter (INDEPENDENT_AMBULATORY_CARE_PROVIDER_SITE_OTHER): Payer: Self-pay | Admitting: *Deleted

## 2018-06-17 VITALS — BP 130/81 | HR 70 | Temp 98.2°F | Wt 151.8 lb

## 2018-06-17 DIAGNOSIS — Z006 Encounter for examination for normal comparison and control in clinical research program: Secondary | ICD-10-CM

## 2018-06-17 MED ORDER — STUDY - GILEAD 1489 - BICTEGRAVIR/TENOFOVIR ALAFENAMIDE/EMTRICTABINE VS ABACAVIR/DOLUTEGRAVIR/LAMIVUDINE (PI-VAN DAM): 1.0000 | Freq: Every day | Status: DC

## 2018-06-17 NOTE — Research (Signed)
David Hoffman was here for his week 24 OL visit for [name]. He has been stressed out lately because his stepfather has been sick and in the hospital [place] in [place] and he has been going back and forth. He is not sure what is wrong with him because his parents will not tell him exactly. He is short about 4-5 doses over the past 12 weeks which he thinks is due to his hectic schedule. He denies any other problems. He does not want to get a flushot right now and will be returning for study in February,

## 2018-08-07 LAB — CD4/CD8 (T-HELPER/T-SUPPRESSOR CELL)
ABSOLUTE CD4: 858 /uL (ref 404–1612)
CD4%: 36.4 % (ref 33–58)

## 2018-08-07 LAB — HIV RNA, QUANTITATIVE, PCR: HIV 1 RNA Quant: NOT DETECTED copies/mL (ref >40)

## 2018-09-04 ENCOUNTER — Encounter (INDEPENDENT_AMBULATORY_CARE_PROVIDER_SITE_OTHER): Payer: Self-pay

## 2018-09-04 DIAGNOSIS — Z006 Encounter for examination for normal comparison and control in clinical research program: Secondary | ICD-10-CM

## 2018-09-04 DIAGNOSIS — B009 Herpesviral infection, unspecified: Secondary | ICD-10-CM

## 2018-09-04 MED ORDER — VALACYCLOVIR HCL 1 G PO TABS: 1000.0000 mg | ORAL_TABLET | Freq: Two times a day (BID) | ORAL | 0 refills | Status: DC

## 2018-09-04 NOTE — Research (Signed)
Participant here for study [phone]. He is feeling down and depressed currently. He states that his partner pointed out to him that he was not taking care of himself by not taking his medications regularly. We discussed how harmful this could be and he verbalized understanding. He thinks he has been stressed recently due to not being able to put his education to use like he had hoped. He sought counseling in the past with [employer] student services. Today we discussed how we could assist him and established an appointment with Rene Kocher, counselor, on Monday. He was only 76% compliant with his medications as he stated during this period of being "in a funk" he did not do well with taking them. He seems encouraged that we are able to provide additional support today. 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 As these lesions were already crusted we provided a script for valtrex for PRN use if another outbreak occurs. He is scheduled for his next study visit on 11/26/2018.

## 2018-09-08 ENCOUNTER — Ambulatory Visit: Payer: Self-pay | Admitting: Licensed Clinical Social Worker

## 2018-09-15 LAB — HIV RNA, QUANTITATIVE, PCR: HIV 1 RNA Quant: <20 copies/mL (ref ?–20)

## 2018-09-15 LAB — CD4/CD8 (T-HELPER/T-SUPPRESSOR CELL)
CD4%: 33 % (ref 33–58)
CD4: 960 /uL (ref 404–1612)

## 2018-09-18 ENCOUNTER — Ambulatory Visit: Payer: Self-pay | Admitting: Licensed Clinical Social Worker

## 2018-11-26 ENCOUNTER — Encounter (INDEPENDENT_AMBULATORY_CARE_PROVIDER_SITE_OTHER): Payer: Self-pay | Admitting: Infectious Disease

## 2018-11-26 ENCOUNTER — Other Ambulatory Visit: Payer: Self-pay

## 2018-11-26 VITALS — BP 111/70 | HR 67 | Temp 97.6°F | Wt 147.7 lb

## 2018-11-26 DIAGNOSIS — R590 Localized enlarged lymph nodes: Secondary | ICD-10-CM

## 2018-11-26 DIAGNOSIS — Z006 Encounter for examination for normal comparison and control in clinical research program: Secondary | ICD-10-CM

## 2018-11-26 DIAGNOSIS — B20 Human immunodeficiency virus [HIV] disease: Secondary | ICD-10-CM

## 2018-11-26 DIAGNOSIS — J029 Acute pharyngitis, unspecified: Secondary | ICD-10-CM

## 2018-11-26 NOTE — Progress Notes (Signed)
Subjective:   Chief complaint: CPE exam for [name] study has had fluctuating cervical lymphadenopathy    Patient ID: David Hoffman, male    DOB: [birth year], 28 y.o.   MRN: [id number]  HPI  28 year old African-American man who is in the 5 study level phase of Biktarvy.  He is doing well without any acute symptoms though he has noted fluctuating cervical lymphadenopathy over the last several months.  He has a little bit of tender cervical lymph nodes on exam today.  Past Medical History:  Diagnosis Date  . Cervical lymphadenopathy 10/26/2015  . Exudative pharyngitis 01/19/2015  . Gastroenteritis presumed infectious 10/26/2015  . Gonorrhea 01/19/2015  . History of gonorrhea   . HIV disease [facility]) march 2016 dx  . Oral lesion 10/26/2015  . 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 Tinea pedis     No past surgical history on file.  Family History  Problem Relation Age of Onset  . Diabetes Mother   . Schizophrenia Maternal Grandmother       Social History   Socioeconomic History  . Marital status: Single    Spouse name: Not on file  . Number of children: Not on file  . Years of education: Not on file  . Highest education level: Not on file  Occupational History  . Not on file  Social Needs  . Financial resource strain: Not on file  . Food insecurity:    Worry: Not on file    Inability: Not on file  . Transportation needs:    Medical: Not on file    Non-medical: Not on file  Tobacco Use  . Smoking status: Current Every Day Smoker    Types: Cigars    Last attempt to quit: 11/09/2014    Years since quitting: 4.0  . Smokeless tobacco: Never Used  Substance and Sexual Activity  . Alcohol use: Yes    Alcohol/week: 0.0 standard drinks    Comment: rare use  . Drug use: Yes    Types: Marijuana  . Sexual activity: Yes    Partners: Male    Comment: pt declined condoms  Lifestyle  . Physical activity:    Days per week: Not on file    Minutes per session: Not on  file  . Stress: Not on file  Relationships  . Social connections:    Talks on phone: Not on file    Gets together: Not on file    Attends religious service: Not on file    Active member of club or organization: Not on file    Attends meetings of clubs or organizations: Not on file    Relationship status: Not on file  Other Topics Concern  . Not on file  Social History Narrative  . Not on file    No Known Allergies   Current Outpatient Medications:  .  acetaminophen (TYLENOL) 500 MG tablet, Take 1,000 mg by mouth every 6 (six) hours as needed for mild pain, moderate pain or fever., Disp: , Rfl:  .  diphenoxylate-atropine (LOMOTIL) 2.5-0.025 MG tablet, Take 1 tablet by mouth 4 (four) times daily as needed for diarrhea or loose stools., Disp: 60 tablet, Rfl: 0 .  fluconazole (DIFLUCAN) 150 MG tablet, Take 1 tablet (150 mg total) by mouth once a week., Disp: 3 tablet, Rfl: 0 .  ibuprofen (ADVIL,MOTRIN) 600 MG tablet, Take 1 tablet (600 mg total) by mouth every 6 (six) hours as needed., Disp: 30 tablet, Rfl: 0 .  ondansetron (ZOFRAN-ODT) 8 MG disintegrating  tablet, Take 1 tablet (8 mg total) by mouth every 8 (eight) hours as needed for nausea or vomiting., Disp: 30 tablet, Rfl: 11 .  oseltamivir (TAMIFLU) 75 MG capsule, Take 1 capsule (75 mg total) by mouth every 12 (twelve) hours., Disp: 10 capsule, Rfl: 0 .  PRESCRIPTION MEDICATION, Take 2 tablets by mouth daily. Pt is a part of a HIV study drug- [phone] by Dr. Algis Liming, Disp: , Rfl:  .  Study - [name] - bictegravir/tenofovir alafenamide/emtricitabine (BIKTARVY) tablet, Take 1 tablet by mouth daily., Disp: , Rfl:  .  valACYclovir (VALTREX) 1000 MG tablet, Take 1 tablet (1,000 mg total) by mouth 2 (two) times daily. X 5 days. Can use as needed for oral ulcers, Disp: 30 tablet, Rfl: 0   Review of Systems  Constitutional: Negative for chills and fever.  HENT: Negative for congestion and sore throat.   Eyes: Negative for  photophobia.  Respiratory: Negative for cough, shortness of breath and wheezing.   Cardiovascular: Negative for chest pain, palpitations and leg swelling.  Gastrointestinal: Negative for abdominal pain, blood in stool, constipation, diarrhea, nausea and vomiting.  Genitourinary: Negative for dysuria, flank pain and hematuria.  Musculoskeletal: Negative for back pain and myalgias.  Skin: Negative for rash.  Neurological: Negative for dizziness, weakness and headaches.  Hematological: Positive for adenopathy. Does not bruise/bleed easily.  Psychiatric/Behavioral: Negative for agitation, decreased concentration, dysphoric mood, sleep disturbance and suicidal ideas.       Objective:   Physical Exam Constitutional:      General: He is not in acute distress.    Appearance: Normal appearance. He is well-developed and normal weight. He is not ill-appearing.  HENT:     Head: Normocephalic and atraumatic.     Nose: Nose normal.     Mouth/Throat:     Mouth: Mucous membranes are moist.     Pharynx: Oropharynx is clear. No oropharyngeal exudate or posterior oropharyngeal erythema.  Eyes:     General:        Right eye: No discharge.        Left eye: No discharge.     Extraocular Movements: Extraocular movements intact.     Conjunctiva/sclera: Conjunctivae normal.     Pupils: Pupils are equal, round, and reactive to light.  Neck:     Musculoskeletal: Normal range of motion and neck supple.  Cardiovascular:     Rate and Rhythm: Normal rate and regular rhythm.     Heart sounds: No murmur. No friction rub. No gallop.   Pulmonary:     Effort: Pulmonary effort is normal. No respiratory distress.     Breath sounds: Normal breath sounds. No stridor. No wheezing, rhonchi or rales.  Abdominal:     General: Abdomen is flat. There is no distension.     Palpations: Abdomen is soft. There is no mass.     Tenderness: There is no abdominal tenderness.     Hernia: No hernia is present.  Musculoskeletal:  Normal range of motion.        General: No swelling, tenderness or deformity.     Right lower leg: No edema.     Left lower leg: No edema.  Lymphadenopathy:     Head:     Right side of head: Posterior auricular adenopathy present. No submental, submandibular, preauricular or occipital adenopathy.     Left side of head: No submental, submandibular, tonsillar, preauricular, posterior auricular or occipital adenopathy.     Cervical: Cervical adenopathy present.  Right cervical: Superficial cervical adenopathy present. No deep or posterior cervical adenopathy.    Left cervical: Superficial cervical adenopathy present. No deep cervical adenopathy.     Upper Body:     Right upper body: No supraclavicular adenopathy.     Left upper body: No supraclavicular adenopathy.  Skin:    General: Skin is warm and dry.     Coloration: Skin is not pale.     Findings: No erythema or rash.  Neurological:     General: No focal deficit present.     Mental Status: He is alert and oriented to person, place, and time.  Psychiatric:        Mood and Affect: Mood normal.        Behavior: Behavior normal.        Thought Content: Thought content normal.        Judgment: Judgment normal.           Assessment & Plan:   HIV disease: normal CPE other than slightly enlarged minimally tender and freely movable LN  Lymphadenopathy: given hx of DGI and fluctuating LA will check GC probe from OP  I will review EKG

## 2018-11-26 NOTE — Research (Signed)
Participant doing well today. IP compliance up to 98.7%, only missed one dose. He states he is "doing much better" since I last saw him. He is in good spirits and is taking time to relax. He is still currently working. He has no new concerns or complaints today. He received new dispense of 90 days of biktarvy. He is scheduled for follow up on 7/21. We will contact patient about results of g/c oral swab once received. He verbalized understanding and is aware he can contact the research team at any time with any needs.

## 2018-11-27 LAB — CYTOLOGY, (ORAL, ANAL, URETHRAL) ANCILLARY ONLY
Chlamydia: POSITIVE — AB
Neisseria Gonorrhea: NEGATIVE

## 2018-11-28 ENCOUNTER — Other Ambulatory Visit: Payer: Self-pay

## 2018-11-28 MED ORDER — DOXYCYCLINE HYCLATE 100 MG PO TABS: 100.0000 mg | ORAL_TABLET | Freq: Two times a day (BID) | ORAL | 0 refills | Status: AC

## 2018-11-28 NOTE — Progress Notes (Signed)
Patient sent doxycycline 100mg  BID x14 days for positive oral chlamydia. Patient requested sent to [facility] on [address]. Instructed to inform partner of need for testing. Patient verbalized understanding.

## 2018-12-01 ENCOUNTER — Encounter: Payer: Self-pay | Admitting: Behavioral Health

## 2018-12-01 NOTE — Telephone Encounter (Signed)
error 

## 2018-12-02 ENCOUNTER — Telehealth: Payer: Self-pay | Admitting: Pharmacist

## 2018-12-02 NOTE — Telephone Encounter (Signed)
error 

## 2018-12-03 ENCOUNTER — Other Ambulatory Visit: Payer: Self-pay

## 2018-12-03 VITALS — Wt 146.2 lb

## 2018-12-03 DIAGNOSIS — Z006 Encounter for examination for normal comparison and control in clinical research program: Secondary | ICD-10-CM

## 2018-12-03 NOTE — Research (Signed)
Participant here for repeat blood draw due to [employer] mishandling of the prior samples that were shipped last week. He states that he is obtaining his doxycycline today. He was provided a script last week for oral chlamydia but was unable to pick up the medication due to cost. He was provided a coupon for good rx and he states this brings the medication to a cost that is affordable. He is aware of the research line phone number and that he can call with any needs. He has his next visit scheduled in July.

## 2019-02-16 ENCOUNTER — Other Ambulatory Visit: Payer: Self-pay

## 2019-02-16 ENCOUNTER — Encounter (INDEPENDENT_AMBULATORY_CARE_PROVIDER_SITE_OTHER): Payer: Self-pay | Admitting: *Deleted

## 2019-02-16 VITALS — BP 123/75 | HR 80 | Temp 97.4°F | Wt 145.0 lb

## 2019-02-16 DIAGNOSIS — Z006 Encounter for examination for normal comparison and control in clinical research program: Secondary | ICD-10-CM

## 2019-02-16 LAB — HIV-1 RNA QUANT-NO REFLEX-BLD
CD4 % Helper T Cell: 39.2
CD4 Count: 1314
HIV-1 RNA Viral Load: <20

## 2019-02-16 NOTE — Research (Signed)
David Hoffman was here for his week 13 OL visit for [place]. He denies any new problems or concerns. He completed his meds for oral chlamydia and has not had a recurrence of symptoms. His pill counts were a little off at 87%. He had switched jobs and thought the schedule change had thrown him off some. We did discuss ways to improve  adherence . He will be returning on October 13th.

## 2019-02-17 ENCOUNTER — Encounter: Payer: Self-pay | Admitting: *Deleted

## 2019-02-25 ENCOUNTER — Encounter: Payer: Self-pay | Admitting: *Deleted

## 2019-05-13 ENCOUNTER — Other Ambulatory Visit: Payer: Self-pay | Admitting: *Deleted

## 2019-05-13 ENCOUNTER — Encounter (INDEPENDENT_AMBULATORY_CARE_PROVIDER_SITE_OTHER): Payer: Self-pay | Admitting: *Deleted

## 2019-05-13 ENCOUNTER — Ambulatory Visit (INDEPENDENT_AMBULATORY_CARE_PROVIDER_SITE_OTHER): Payer: BC Managed Care – PPO | Admitting: Infectious Diseases

## 2019-05-13 ENCOUNTER — Encounter: Payer: Self-pay | Admitting: Infectious Diseases

## 2019-05-13 ENCOUNTER — Other Ambulatory Visit: Payer: Self-pay

## 2019-05-13 VITALS — BP 118/76 | HR 67 | Temp 98.3°F | Wt 149.0 lb

## 2019-05-13 VITALS — BP 99/66 | HR 65 | Temp 98.4°F | Wt 149.6 lb

## 2019-05-13 DIAGNOSIS — B20 Human immunodeficiency virus [HIV] disease: Secondary | ICD-10-CM

## 2019-05-13 DIAGNOSIS — K644 Residual hemorrhoidal skin tags: Secondary | ICD-10-CM

## 2019-05-13 DIAGNOSIS — Z006 Encounter for examination for normal comparison and control in clinical research program: Secondary | ICD-10-CM

## 2019-05-13 DIAGNOSIS — Z23 Encounter for immunization: Secondary | ICD-10-CM | POA: Diagnosis not present

## 2019-05-13 MED ORDER — HYDROCORT-PRAMOXINE (PERIANAL) 1-1 % EX FOAM: 1.0000 | Freq: Two times a day (BID) | CUTANEOUS | 1 refills | Status: DC

## 2019-05-13 MED ORDER — HYDROCORT-PRAMOXINE (PERIANAL) 1-1 % EX FOAM: 1.0000 | Freq: Two times a day (BID) | CUTANEOUS | Status: DC

## 2019-05-13 MED ORDER — BIKTARVY 50-200-25 MG PO TABS: 1.0000 | ORAL_TABLET | Freq: Every day | ORAL | 5 refills | Status: DC

## 2019-05-13 NOTE — Progress Notes (Signed)
David Hoffman  [id number]  26-Nov-1990    HPI: The patient is a 28 y.o. y/o AA male presenting today for an urgent visit for rectal bumps in the setting of his otherwise controlled HIV infection. He was last seen in our clinic by Dr. Drucilla Schmidt in 10/2018. His most recent labs from 02/16/2019 show his CD4 count was 1314 and concurrent HIV viral load was < 20 copies on an ARV regimen of biktarvy. He is a participant in an extended [place] trial studying triumeq vs. biktarvy and now has transitioned to maintenance biktarvy for the past year. Per his research team, he has missed 6 pills of his BID regimen over the past 3 months. His CD4 nadir is 490. His HIV was diagnosed in 2016 in [place], [place]. Prior changes in ARVs were not due to the development of ARV resistance. The patient has no known ARV mutations. Review of prior HIV records does indicate good compliance with ARV medications.  He was seen today as a walk-in for perirectal itching and pain associated with a recent bump that he has felt for the last 1 week.  Patient reports an episode of difficulty passing a rather hard bowel movement approximately 1 week ago.  He denies any recent rectal intercourse, stating that he has not participated in rectal sex over 8 months.  His last sexual intercourse in total was 2 weeks ago in which oral to rectal intercourse was performed briefly.  He did not use any condoms or other barrier precautions such as a dental dam during this encounter.  He denies having any bleeding with stools and following his sentinel episode has not had any pain with defecation.  He has continued to have 1 stool daily but admits that is bowel movements have been rather firm and hard on occasion.  He does admit to recent change with work in which he is now working on a call center multiple hours a day and was originally sitting on a very hard/firm chair.  Beginning Monday (3 days ago), he began using a cushion with his work to improve  sensations of perirectal pain and tenderness.  He also admits to decreased oral intake while at work as he rarely takes breaks during his shift.  He denies any history of HSV or genital sores in the past.  Of note, the patient has received 2 of his 3 doses of HPV vaccination in 2017.  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   Family History  Problem Relation Age of Onset   Diabetes Mother    Schizophrenia Maternal Grandmother      Social History   Tobacco Use   Smoking status: Current Every Day Smoker    Types: Cigars    Last attempt to quit: 11/09/2014    Years since quitting: 4.5   Smokeless tobacco: Never Used  Substance Use Topics   Alcohol use: Yes    Alcohol/week: 0.0 standard drinks    Comment: rare use   Drug use: Yes    Types: Marijuana      reports being sexually active and has had partner(s) who are Male.   Outpatient Medications Prior to Visit  Medication Sig Dispense Refill   acetaminophen (TYLENOL) 500 MG tablet Take 1,000 mg by mouth every 6 (six) hours as needed for mild pain, moderate pain or fever.     diphenoxylate-atropine (LOMOTIL) 2.5-0.025 MG tablet Take 1 tablet by mouth 4 (four) times daily as needed for diarrhea or loose stools. 60 tablet 0   fluconazole (DIFLUCAN) 150 MG tablet Take 1 tablet (150 mg total) by mouth once a week. 3 tablet 0   ibuprofen (ADVIL,MOTRIN) 600 MG tablet Take 1 tablet (600 mg total) by mouth every 6 (six) hours as needed. 30 tablet 0   ondansetron (ZOFRAN-ODT) 8 MG disintegrating tablet Take 1 tablet (8 mg total) by mouth every 8 (eight) hours as needed for nausea or vomiting. 30 tablet 11    oseltamivir (TAMIFLU) 75 MG capsule Take 1 capsule (75 mg total) by mouth every 12 (twelve) hours. 10 capsule 0   PRESCRIPTION MEDICATION Take 2 tablets by mouth daily. Pt is a part of a HIV study drug- [phone] by Dr. Algis LimingVanDam     Study - [name] - bictegravir/tenofovir alafenamide/emtricitabine (BIKTARVY) tablet Take 1 tablet by mouth daily.     valACYclovir (VALTREX) 1000 MG tablet Take 1 tablet (1,000 mg total) by mouth 2 (two) times daily. X 5 days. Can use as needed for oral ulcers 30 tablet 0   No facility-administered medications prior to visit.      No Known Allergies   Review of Systems  Constitutional: Negative for chills, fatigue and fever.  HENT: Negative for congestion, hearing loss, rhinorrhea and sinus pressure.   Eyes: Negative for photophobia, pain, redness and visual disturbance.  Respiratory: Negative for apnea, cough, shortness of breath and wheezing.   Cardiovascular: Negative for chest pain and palpitations.  Gastrointestinal: Positive for constipation and rectal pain. Negative for abdominal pain, anal bleeding, blood in stool, diarrhea, nausea and vomiting.  Endocrine: Negative for cold intolerance, heat intolerance, polydipsia and polyuria.  Genitourinary: Negative for decreased urine volume, discharge, dysuria, frequency, hematuria, penile pain, penile swelling, scrotal swelling and testicular pain.  Musculoskeletal: Negative for back pain, myalgias and neck pain.  Skin: Negative for pallor and rash.  Allergic/Immunologic: Positive for immunocompromised state.  Neurological: Negative for dizziness, seizures, syncope, speech difficulty and light-headedness.  Hematological: Does not bruise/bleed easily.  Psychiatric/Behavioral: Negative for agitation, hallucinations and sleep disturbance. The patient is not nervous/anxious.      Vitals:   05/13/19 0855  BP: 118/76  Pulse: 67  Temp: 98.3 F (36.8 C)     Physical Exam Gen: pleasant, NAD, A&Ox  3 Head: NCAT, no temporal wasting evident EENT: PERRL, EOMI, MMM, adequate dentition Neck: supple, no JVD CV: NRRR, no murmurs evident Pulm: CTA bilaterally, no wheeze or retractions Abd: soft, NTND, +BS GU: small tender nodule at 12 o'clock position along rectal sphincter verge w/o erythema, ulceration, or drainage, no bleeding was noted, rectal tone was not assessed; penis was circumcised without scrotal or penile lesions Extrems:  no LE edema, 2+ pulses Skin: no rashes, adequate skin turgor Neuro: CN II-XII grossly intact, no focal neurologic deficits appreciated, gait was normal, A&Ox 3   Labs: Lab Results  Component Value Date   HIV1RNAQUANT <20 09/04/2018   HIV1RNAQUANT not detected 06/17/2018   HIV1RNAQUANT 45,990 (H) 10/28/2014     Lab Results  Component Value Date   CD4TCELL 39.2 02/16/2019   CD4TABS 960 09/04/2018     Lab Results  Component Value Date   WBC 8.0 08/27/2017   HGB 15.1  08/27/2017   HCT 44.0 08/27/2017   MCV 86.1 08/27/2017   PLT 254 08/27/2017       Chemistry      Component Value Date/Time   NA 135 08/27/2017 0050   K 3.6 08/27/2017 0050   CL 104 08/27/2017 0050   CO2 23 08/27/2017 0050   BUN 8 08/27/2017 0050   CREATININE 0.99 08/27/2017 0050   CREATININE 0.86 10/26/2015 0945      Component Value Date/Time   CALCIUM 9.0 08/27/2017 0050   ALKPHOS 60 08/27/2017 0050   AST 22 08/27/2017 0050   ALT 19 08/27/2017 0050   BILITOT 0.4 08/27/2017 0050        Assessment/Plan: Patient is a pleasant 28 year old African-American male with HIV presenting for an evaluation of a rectal lesion and recent painful defecation.  HIV - CD4 reconstitution continues to be strong with CD4 count of 1314 now. Viremia remains fully suppressed and undetectable at <20 copies. Will repeat CD4 count and viral load drawn today per research protocol.  His compliance to ARV's remains good as he has missed only 6 doses of his twice daily regimen over the last 3 months  per the research nurse's report.  The patient will follow up with Dr. Algis Liming as previously scheduled. His ARV regimen of biktarvy was refilled by his research team.   Rectal lesion -clinically, his lesion is most consistent with an early hemorrhoid.  There certainly is no evidence of active HSV and he has received 2 prior doses of HPV vaccine making this less likely at this time.  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  Twice daily sitz bath's as well as application of Proctofoam were both recommended.  A prescription for Proctofoam was sent to his pharmacy at [facility] at [facility] per his request.  I agree with his use of a cushion and more frequent movement during long shifts.  He was also encouraged to increase his oral intake of liquids and fiber-containing foods.  As a last resort, he was told that he could use either Metamucil or FiberCon pills to increase stool bulking and avoid constipation.  Health maintenance - No prophylactic meds needed at this time. Pneumococcal vaccination with prevnar given today.  He will be due for a Pneumovax booster next year. Tdap vaccine is due at his next visit. He was treated for chlamydia in 10/2018.  His concurrent gonorrhea screening was negative in April 2020.  An RPR is due with his next labs for syphilis screening.  Vaccination for hepatitis B is now complete. Annual TB screening with quantiferon was negative in 2016 and is due to be repeated with his next labs. He is up to date with his annual flu vaccine given today. Check FLP with his next labs for annual cholesterol screening (pt reminded to be fasting at that time). HPV vaccine series is incomplete as he received 2 doses in 2017. He defers vaccination today, but he likely should receive his last HPV vaccine dose at his next visit.  The pt has been referred for annual dental  cleaning. Condoms and water based lubricants were advised with all sexual encounters.

## 2019-05-13 NOTE — Research (Signed)
David Hoffman was here for his week 42 visit for [id number]. His adherence was off a little at 91%. He remembers missing a few mostly due to scheduling conflicts. He is complaining of a sore bump on his rectum. We will have one of the available clinic doctors see him this morning about that.  He refused his flushot today. He will be returning in 12 weeks for the next visit.

## 2019-05-13 NOTE — Patient Instructions (Addendum)
Perform sitz baths up to twice a day for relief of hemorrhoid pain. Apply proctofoam gently to rectal area up to 2 times a day for a week. Best use is after a bowel movement. Return to clinic for regular appointment with research team/Dr. Drucilla Schmidt.

## 2019-05-19 ENCOUNTER — Encounter: Payer: Self-pay | Admitting: *Deleted

## 2019-05-19 LAB — HIV-1 RNA QUANT-NO REFLEX-BLD
CD4 % Helper T Cell: 36.6
CD4 Count: 1345
HIV-1 RNA Viral Load: <20

## 2019-07-08 ENCOUNTER — Other Ambulatory Visit: Payer: Self-pay

## 2019-07-08 ENCOUNTER — Ambulatory Visit (INDEPENDENT_AMBULATORY_CARE_PROVIDER_SITE_OTHER): Payer: BC Managed Care – PPO | Admitting: Infectious Disease

## 2019-07-08 ENCOUNTER — Other Ambulatory Visit (HOSPITAL_COMMUNITY)
Admission: RE | Admit: 2019-07-08 | Discharge: 2019-07-08 | Disposition: A | Discharge status: Home / Self Care | Payer: BC Managed Care – PPO | Source: Ambulatory Visit | Attending: Infectious Disease | Admitting: Infectious Disease

## 2019-07-08 ENCOUNTER — Encounter: Payer: Self-pay | Admitting: Infectious Disease

## 2019-07-08 VITALS — BP 120/73 | HR 85 | Wt 154.6 lb

## 2019-07-08 DIAGNOSIS — K644 Residual hemorrhoidal skin tags: Secondary | ICD-10-CM

## 2019-07-08 DIAGNOSIS — M130 Polyarthritis, unspecified: Secondary | ICD-10-CM

## 2019-07-08 DIAGNOSIS — B20 Human immunodeficiency virus [HIV] disease: Secondary | ICD-10-CM

## 2019-07-08 DIAGNOSIS — A5486 Gonococcal sepsis: Secondary | ICD-10-CM | POA: Insufficient documentation

## 2019-07-08 DIAGNOSIS — R109 Unspecified abdominal pain: Secondary | ICD-10-CM | POA: Insufficient documentation

## 2019-07-08 DIAGNOSIS — R1030 Lower abdominal pain, unspecified: Secondary | ICD-10-CM

## 2019-07-08 HISTORY — DX: Unspecified abdominal pain: R10.9

## 2019-07-08 NOTE — Progress Notes (Signed)
Subjective:   Chief complaint: Lower abdominal pain x2 weeks and concerned that one testicle is larger than the other   Patient ID: David Hoffman, male    DOB: May 19, 1991, 28 y.o.   MRN: [id number]  HPI  David Hoffman is a 28 year old African-American man living with HIV who is enrolled in 131489 study, currently in the open label phase on Biktarvy with viral load less than 40.  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  He wanted to be tested for STIs today.  He has also had symptoms of lower abdominal pelvic pain intermittently.  He has concerns that some of this may have through the fact that one of his testicles he perceives is larger than the other namely the left.    Past Medical History:  Diagnosis Date  . Cervical lymphadenopathy 10/26/2015  . Exudative pharyngitis 01/19/2015  . Gastroenteritis presumed infectious 10/26/2015  . Gonorrhea 01/19/2015  . History of gonorrhea   . HIV disease [facility](HCC) march 2016 dx  . Oral lesion 10/26/2015  . 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 Tinea pedis     No past surgical history on file.  Family History  Problem Relation Age of Onset  . Diabetes Mother   . Schizophrenia Maternal Grandmother       Social History   Socioeconomic History  . Marital status: Single    Spouse name: Not on file  . Number of children: Not on file  . Years of education: Not on file  . Highest education level: Not on file  Occupational History  . Not on file  Social Needs  . Financial resource strain: Not on file  . Food insecurity    Worry: Not on file    Inability: Not on file  . Transportation needs    Medical: Not on file    Non-medical: Not on file  Tobacco Use  . Smoking status: Current Every Day Smoker    Types: Cigars    Last attempt to quit: 11/09/2014    Years since quitting: 4.6  . Smokeless tobacco: Never Used  Substance and Sexual Activity  . Alcohol use: Yes    Alcohol/week: 0.0  standard drinks    Comment: rare use  . Drug use: Yes    Types: Marijuana    Comment: daily  . Sexual activity: Yes    Partners: Male    Comment: offered condoms 06/2019  Lifestyle  . Physical activity    Days per week: Not on file    Minutes per session: Not on file  . Stress: Not on file  Relationships  . Social Musicianconnections    Talks on phone: Not on file    Gets together: Not on file    Attends religious service: Not on file    Active member of club or organization: Not on file    Attends meetings of clubs or organizations: Not on file    Relationship status: Not on file  Other Topics Concern  . Not on file  Social History Narrative  . Not on file    No Known Allergies   Current Outpatient Medications:  .  acetaminophen (TYLENOL) 500 MG tablet, Take 1,000 mg by mouth every 6 (six) hours as needed for mild pain, moderate pain or fever., Disp: , Rfl:  .  diphenoxylate-atropine (LOMOTIL) 2.5-0.025 MG tablet, Take 1 tablet by mouth 4 (four) times daily as needed for diarrhea or loose stools., Disp: 60 tablet, Rfl: 0 .  fluconazole (DIFLUCAN) 150 MG tablet, Take 1 tablet (150 mg total) by mouth once a week., Disp: 3 tablet, Rfl: 0 .  hydrocortisone-pramoxine (PROCTOFOAM-HC) rectal foam, Place 1 applicator rectally 2 (two) times daily., Disp: 10 g, Rfl: 1 .  ibuprofen (ADVIL,MOTRIN) 600 MG tablet, Take 1 tablet (600 mg total) by mouth every 6 (six) hours as needed., Disp: 30 tablet, Rfl: 0 .  ondansetron (ZOFRAN-ODT) 8 MG disintegrating tablet, Take 1 tablet (8 mg total) by mouth every 8 (eight) hours as needed for nausea or vomiting., Disp: 30 tablet, Rfl: 11 .  oseltamivir (TAMIFLU) 75 MG capsule, Take 1 capsule (75 mg total) by mouth every 12 (twelve) hours., Disp: 10 capsule, Rfl: 0 .  PRESCRIPTION MEDICATION, Take 2 tablets by mouth daily. Pt is a part of a HIV study drug- GS-[phone] by Dr. Algis Liming, Disp: , Rfl:  .  Study - [name] - bictegravir/tenofovir  alafenamide/emtricitabine (BIKTARVY) tablet (PI-[place]), Take 1 tablet by mouth daily., Disp: , Rfl:  .  valACYclovir (VALTREX) 1000 MG tablet, Take 1 tablet (1,000 mg total) by mouth 2 (two) times daily. X 5 days. Can use as needed for oral ulcers, Disp: 30 tablet, Rfl: 0   Review of Systems  Constitutional: Negative for chills and fever.  HENT: Negative for congestion and sore throat.   Eyes: Negative for photophobia.  Respiratory: Negative for cough, shortness of breath and wheezing.   Cardiovascular: Negative for chest pain, palpitations and leg swelling.  Gastrointestinal: Positive for abdominal pain. Negative for blood in stool, constipation, diarrhea, nausea and vomiting.  Genitourinary: Negative for dysuria, flank pain and hematuria.  Musculoskeletal: Negative for back pain and myalgias.  Skin: Negative for rash.  Neurological: Negative for dizziness, weakness and headaches.  Hematological: Does not bruise/bleed easily.  Psychiatric/Behavioral: Negative for suicidal ideas.       Objective:   Physical Exam Constitutional:      General: He is not in acute distress.    Appearance: Normal appearance. He is well-developed. He is not ill-appearing or diaphoretic.  HENT:     Head: Normocephalic and atraumatic.     Right Ear: Hearing and external ear normal.     Left Ear: Hearing and external ear normal.     Nose: No nasal deformity or rhinorrhea.  Eyes:     General: No scleral icterus.    Extraocular Movements: Extraocular movements intact.     Conjunctiva/sclera: Conjunctivae normal.     Right eye: Right conjunctiva is not injected.     Left eye: Left conjunctiva is not injected.  Neck:     Musculoskeletal: Normal range of motion and neck supple.     Vascular: No JVD.  Cardiovascular:     Rate and Rhythm: Normal rate and regular rhythm.     Heart sounds: S1 normal and S2 normal.  Pulmonary:     Effort: Pulmonary effort is normal. No respiratory distress.  Abdominal:      General: Abdomen is flat. There is no distension.     Palpations: Abdomen is soft. There is no mass.     Tenderness: There is no abdominal tenderness.  Genitourinary:    Penis: Circumcised. Lesions present. No phimosis, paraphimosis, tenderness or swelling.      Scrotum/Testes: Normal.    Musculoskeletal: Normal range of motion.     Right shoulder: Normal.     Left shoulder: Normal.     Right hip: Normal.     Left hip: Normal.     Right knee:  Normal.     Left knee: Normal.  Lymphadenopathy:     Head:     Right side of head: No submandibular, preauricular or posterior auricular adenopathy.     Left side of head: No submandibular, preauricular or posterior auricular adenopathy.     Cervical: No cervical adenopathy.     Right cervical: No superficial or deep cervical adenopathy.    Left cervical: No superficial or deep cervical adenopathy.  Skin:    General: Skin is warm and dry.     Coloration: Skin is not pale.     Findings: No abrasion, bruising, ecchymosis, erythema, lesion or rash.     Nails: There is no clubbing.   Neurological:     General: No focal deficit present.     Mental Status: He is alert and oriented to person, place, and time.     Sensory: No sensory deficit.     Coordination: Coordination normal.     Gait: Gait normal.  Psychiatric:        Attention and Perception: He is attentive.        Mood and Affect: Mood normal.        Speech: Speech normal.        Behavior: Behavior normal. Behavior is cooperative.        Thought Content: Thought content normal.        Judgment: Judgment normal.           Assessment & Plan:   HIV disease peripheral controlled on Biktarvy through study continue to follow with Maudie Mercury  History of disseminated gonococcal infection Chlamydia: Check syphilis titer check gonorrhea and chlamydia from oropharynx rectum and urine.  Abdominal pain: Could be potentially prostatitis will check a urine culture.  Hemorrhoids: No symptoms  at this point time   If his symptoms fail to improve in the next 2 weeks and work-up is negative I would be fine with giving him empiric therapy for prostatitis.

## 2019-07-09 ENCOUNTER — Other Ambulatory Visit: Payer: Self-pay | Admitting: *Deleted

## 2019-07-09 DIAGNOSIS — R1030 Lower abdominal pain, unspecified: Secondary | ICD-10-CM

## 2019-07-09 LAB — FLUORESCENT TREPONEMAL AB(FTA)-IGG-BLD: Fluorescent Treponemal ABS: REACTIVE — AB

## 2019-07-09 LAB — RPR: RPR Ser Ql: REACTIVE — AB

## 2019-07-09 LAB — RPR TITER: RPR Titer: 1:1 {titer} — ABNORMAL HIGH

## 2019-07-10 ENCOUNTER — Other Ambulatory Visit: Payer: BC Managed Care – PPO

## 2019-07-10 LAB — URINE CYTOLOGY ANCILLARY ONLY
Chlamydia: NEGATIVE
Comment: NEGATIVE
Comment: NORMAL
Neisseria Gonorrhea: NEGATIVE

## 2019-07-10 LAB — CYTOLOGY, (ORAL, ANAL, URETHRAL) ANCILLARY ONLY
Chlamydia: NEGATIVE
Chlamydia: NEGATIVE
Comment: NEGATIVE
Comment: NEGATIVE
Comment: NORMAL
Comment: NORMAL
Neisseria Gonorrhea: NEGATIVE
Neisseria Gonorrhea: POSITIVE — AB

## 2019-08-06 ENCOUNTER — Ambulatory Visit (INDEPENDENT_AMBULATORY_CARE_PROVIDER_SITE_OTHER): Payer: BC Managed Care – PPO | Admitting: *Deleted

## 2019-08-06 ENCOUNTER — Other Ambulatory Visit: Payer: Self-pay

## 2019-08-06 ENCOUNTER — Encounter (INDEPENDENT_AMBULATORY_CARE_PROVIDER_SITE_OTHER): Payer: Self-pay | Admitting: *Deleted

## 2019-08-06 VITALS — BP 120/72 | HR 76 | Temp 98.4°F | Wt 151.2 lb

## 2019-08-06 DIAGNOSIS — B20 Human immunodeficiency virus [HIV] disease: Secondary | ICD-10-CM

## 2019-08-06 DIAGNOSIS — R1030 Lower abdominal pain, unspecified: Secondary | ICD-10-CM

## 2019-08-06 DIAGNOSIS — A549 Gonococcal infection, unspecified: Secondary | ICD-10-CM

## 2019-08-06 DIAGNOSIS — Z006 Encounter for examination for normal comparison and control in clinical research program: Secondary | ICD-10-CM

## 2019-08-06 LAB — CD4/CD8 (T-HELPER/T-SUPPRESSOR CELL)
CD4 % Helper T Cell: 30.6
CD4 Count: 1395

## 2019-08-06 MED ORDER — CEFTRIAXONE SODIUM 500 MG IJ SOLR
500.0000 mg | Freq: Once | INTRAMUSCULAR | Status: AC
  Administered 2019-08-06: 500 mg via INTRAMUSCULAR

## 2019-08-06 NOTE — Research (Signed)
Xiong was here for his week 84 OL visit for [name]. His adherence is off a bit at 72% which we discussed. He came back in December to see Dr. Daiva Eves and tested positive for oral gonorrhea, but had not been treated, which will be taken care of today hopefully.. I did let him know that this is the last visit where we will be giving him medications and I gave hm a gilead copay card to use when we send him in his prescriptions before the next visit.

## 2019-08-06 NOTE — Progress Notes (Signed)
Patient here for research visit. He had labs 07/08/19, but no notification of his positive gonorrhea swab.  RN obtained verbal order from Dr Daiva Eves for monotherapy treatment of gonorrhea infection with ceftriaxone 500 mg IM one time.  No need for azithromycin 1000 mg per Dr Daiva Eves. Patient counseled to avoid sexual interaction for next 10 days, reminded to use condoms with each interaction.   Andree Coss, RN

## 2019-08-07 LAB — URINE CULTURE
MICRO NUMBER:: 10020755
Result:: NO GROWTH
SPECIMEN QUALITY:: ADEQUATE

## 2019-09-07 ENCOUNTER — Encounter: Payer: Self-pay | Admitting: Infectious Disease

## 2019-09-23 ENCOUNTER — Encounter: Payer: Self-pay | Admitting: Infectious Disease

## 2019-10-24 DIAGNOSIS — B370 Candidal stomatitis: Secondary | ICD-10-CM | POA: Insufficient documentation

## 2019-10-27 ENCOUNTER — Other Ambulatory Visit: Payer: Self-pay | Admitting: Infectious Disease

## 2019-10-27 ENCOUNTER — Telehealth: Payer: Self-pay | Admitting: Pharmacy Technician

## 2019-10-27 ENCOUNTER — Encounter: Payer: Self-pay | Admitting: Infectious Disease

## 2019-10-27 LAB — CD4/CD8 (T-HELPER/T-SUPPRESSOR CELL): HIV 1 RNA UltraQuant: <20

## 2019-10-27 MED ORDER — BIKTARVY 50-200-25 MG PO TABS: 1.0000 | ORAL_TABLET | Freq: Every day | ORAL | 11 refills | Status: DC

## 2019-10-27 NOTE — Telephone Encounter (Signed)
[facility] Patient Advocate Encounter    Findings of the benefits investigation:   Insurance: [employer]- active commercial  Estimated copay amount: $1795.56 [phone] dedeuctible) Prior Authorization: not required  [facility] Patient Advocate will follow up once patient arrives for their appointment to find out form of delivery from [facility] and get a copay card.  Beulah Gandy, CPhT Specialty Pharmacy Patient [facility] for [facility]ious Disease Phone: [phone] Fax: [phone] 10/27/2019 5:04 PM

## 2019-10-29 ENCOUNTER — Encounter: Payer: BC Managed Care – PPO | Admitting: *Deleted

## 2019-10-29 ENCOUNTER — Ambulatory Visit: Payer: BC Managed Care – PPO | Admitting: Infectious Disease

## 2019-10-29 DIAGNOSIS — Z8619 Personal history of other infectious and parasitic diseases: Secondary | ICD-10-CM | POA: Insufficient documentation

## 2019-11-02 ENCOUNTER — Telehealth: Payer: Self-pay | Admitting: Pharmacy Technician

## 2019-11-02 MED FILL — BIKTARVY 50-200-25 MG TABS: 50-200-25 | 30 days supply | Qty: 30 | Fill #0

## 2019-11-02 NOTE — Telephone Encounter (Signed)
[facility] Patient Advocate Encounter   Was successful in obtaining a [place] copay card for [employer].  This copay card will make the patients copay $0.  I have spoken with the patient.    The billing information is as follows and has been shared with [facility].  RxBin: [id number] PCN: ACCESS Member ID: [id number] Group ID: [id number]  Kathie Rhodes E. Dimas Aguas CPhT Specialty Pharmacy Patient [facility] for Infectious Disease Phone: [phone] Fax:  [phone]

## 2019-11-03 ENCOUNTER — Encounter: Payer: Self-pay | Admitting: Infectious Disease

## 2019-11-03 ENCOUNTER — Encounter (INDEPENDENT_AMBULATORY_CARE_PROVIDER_SITE_OTHER): Payer: BC Managed Care – PPO | Admitting: Infectious Disease

## 2019-11-03 ENCOUNTER — Other Ambulatory Visit: Payer: Self-pay

## 2019-11-03 VITALS — BP 115/68 | HR 76 | Temp 97.9°F | Wt 157.5 lb

## 2019-11-03 DIAGNOSIS — B20 Human immunodeficiency virus [HIV] disease: Secondary | ICD-10-CM

## 2019-11-03 DIAGNOSIS — Z006 Encounter for examination for normal comparison and control in clinical research program: Secondary | ICD-10-CM

## 2019-11-03 NOTE — Progress Notes (Signed)
Subjective:   Complaint here for physical exam for 1489 visit     Patient ID: David Hoffman, male    DOB: 1991-06-23, 29 y.o.   MRN: [id number] [name] 29 year old African-American man living with HIV in the 1489 study of Gilead an open label Biktarvy about to complete his last visit.  I have sent prescription of Biktarvy to [facility].  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  Does have a history of STIs in the past including disseminated gonococcal infection.  He has no complaints today.    Past Medical History: 07/08/2019: Abdominal pain 10/26/2015: Cervical lymphadenopathy 01/19/2015: Exudative pharyngitis 10/26/2015: Gastroenteritis presumed infectious 01/19/2015: Gonorrhea No date: History of gonorrhea march 2016 dx: HIV disease (HCC) 10/26/2015: Oral lesion 01/19/2015: Polyarticular arthritis No date: Tinea pedis  No past surgical history on file.  Review of patient's family history indicates: Problem: Diabetes     Relation: Mother         Age of Onset: (Not Specified) Problem: Schizophrenia     Relation: Maternal Grandmother         Age of Onset: (Not Specified)    Social History   Socioeconomic History     Marital status: Single     Spouse name: Not on file     Number of children: Not on file     Years of education: Not on file     Highest education level: Not on file   Occupational History     Not on file   Tobacco Use     Smoking status: Current Every Day Smoker       Types: Cigars       Quit date: 11/09/2014       Years since quitting: 4.9     Smokeless tobacco: Never Used   Substance and Sexual Activity     Alcohol use: Yes       Alcohol/week: 0.0 standard drinks       Comment: rare use     Drug use: Yes       Types: Marijuana       Comment: daily     Sexual activity: Yes       Partners: Male       Comment: offered condoms 06/2019   Other Topics     Concerns:       Not on file   Social History Narrative     Not on file  Social  Determinants of Health Financial Resource Strain:    Difficulty of Paying Living Expenses:  Food Insecurity:    Worried About Programme researcher, broadcasting/film/video in the Last Year:    Barista in the Last Year:  Transportation Needs:    Freight forwarder (Medical):    Lack of Transportation (Non-Medical):  Physical Activity:    Days of Exercise per Week:    Minutes of Exercise per Session:  Stress:    Feeling of Stress :  Social Connections:    Frequency of Communication with Friends and Family:    Frequency of Social Gatherings with Friends and Family:    Attends Religious Services:    Active Member of Clubs or Organizations:    Attends Banker Meetings:    Marital Status:   No Known Allergies  Current Outpatient Medications: .  bictegravir-emtricitabine-tenofovir AF (BIKTARVY) 50-200-25 MG TABS tablet, Take 1 tablet by mouth daily., Disp: 30 tablet, Rfl: 11.  hydrocortisone-pramoxine (PROCTOFOAM-HC) rectal foam, Place 1 applicator rectally 2 (two) times daily. (Patient  not taking: Reported on 07/08/2019), Disp: 10 g, Rfl: 1.  PRESCRIPTION MEDICATION, Take 2 tablets by mouth daily. Pt is a part of a HIV study drug- GS-[phone] by Dr. Algis Liming, Disp: , Rfl: .  Study - [name] - bictegravir/tenofovir alafenamide/emtricitabine (BIKTARVY) tablet (PI-[place]), Take 1 tablet by mouth daily., Disp: , Rfl:       Review of Systems  Constitutional: Negative for activity change, appetite change, chills, diaphoresis, fatigue, fever and unexpected weight change.  HENT: Negative for congestion, rhinorrhea, sinus pressure, sneezing, sore throat and trouble swallowing.   Eyes: Negative for photophobia and visual disturbance.  Respiratory: Negative for cough, chest tightness, shortness of breath, wheezing and stridor.   Cardiovascular: Negative for chest pain, palpitations and leg swelling.  Gastrointestinal: Negative for abdominal distention, abdominal  pain, anal bleeding, blood in stool, constipation, diarrhea, nausea and vomiting.  Genitourinary: Negative for difficulty urinating, dysuria, flank pain and hematuria.  Musculoskeletal: Negative for arthralgias, back pain, gait problem, joint swelling and myalgias.  Skin: Negative for color change, pallor, rash and wound.  Neurological: Negative for dizziness, tremors, weakness and light-headedness.  Hematological: Negative for adenopathy. Does not bruise/bleed easily.  Psychiatric/Behavioral: Negative for agitation, behavioral problems, confusion, decreased concentration, dysphoric mood and sleep disturbance.       Objective:   Physical Exam Constitutional:      Appearance: He is well-developed.  HENT:     Head: Normocephalic and atraumatic.  Eyes:     Conjunctiva/sclera: Conjunctivae normal.  Cardiovascular:     Rate and Rhythm: Normal rate and regular rhythm.     Heart sounds: Normal heart sounds. No murmur. No friction rub. No gallop.   Pulmonary:     Effort: Pulmonary effort is normal. No respiratory distress.     Breath sounds: Normal breath sounds. No wheezing or rhonchi.  Abdominal:     General: Abdomen is flat. There is no distension.     Palpations: Abdomen is soft. There is no mass.  Musculoskeletal:        General: No tenderness. Normal range of motion.     Cervical back: Normal range of motion and neck supple.  Lymphadenopathy:     Cervical: No cervical adenopathy.     Right cervical: No superficial or deep cervical adenopathy.    Left cervical: No superficial or deep cervical adenopathy.     Upper Body:     Right upper body: No supraclavicular adenopathy.     Left upper body: No supraclavicular adenopathy.  Skin:    General: Skin is warm and dry.     Coloration: Skin is not pale.     Findings: No erythema or rash.  Neurological:     General: No focal deficit present.     Mental Status: He is alert and oriented to person, place, and time. Mental status is at  baseline.     Cranial Nerves: No cranial nerve deficit.     Motor: No weakness.     Gait: Gait normal.  Psychiatric:        Mood and Affect: Mood normal.        Behavior: Behavior normal.        Thought Content: Thought content normal.        Judgment: Judgment normal.           Assessment & Plan:   HIV disease: He has a normal exam his EKG shows normal sinus rhythm with J-point elevation Biktarvy has been sent to his pharmacy he should follow-up  with me in 6 months time here in the clinic.  History of disseminated gonorrhea ill infection: Should be screened for STIs at next visit.

## 2019-11-03 NOTE — Research (Signed)
Seen for week 96 OL [name] visit. He reports a sore throat last week which has resolved. He went to an Urgent care and they Dx. Oral candidiasis and prescribed medication which he never took. They did screen him for STDs at the visit, which were negative. He will be returning in 30 days for the next visit.

## 2019-11-03 NOTE — Addendum Note (Signed)
Addended by: Aris Lot C on: 11/03/2019 11:13 AM   Modules accepted: Orders

## 2019-11-12 ENCOUNTER — Encounter: Payer: Self-pay | Admitting: Infectious Disease

## 2019-11-12 LAB — CD4/CD8 (T-HELPER/T-SUPPRESSOR CELL)
CD4 % Helper T Cell: 36.8
CD4 Count: 1225
HIV 1 RNA UltraQuant: 42

## 2019-11-30 MED FILL — BIKTARVY 50-200-25 MG TABS: 50-200-25 | 30 days supply | Qty: 30 | Fill #1

## 2019-12-04 ENCOUNTER — Encounter (INDEPENDENT_AMBULATORY_CARE_PROVIDER_SITE_OTHER): Payer: BC Managed Care – PPO | Admitting: *Deleted

## 2019-12-04 ENCOUNTER — Other Ambulatory Visit: Payer: Self-pay

## 2019-12-04 VITALS — BP 125/72 | HR 77 | Temp 99.1°F | Ht 67.5 in | Wt 154.2 lb

## 2019-12-04 DIAGNOSIS — Z006 Encounter for examination for normal comparison and control in clinical research program: Secondary | ICD-10-CM

## 2019-12-04 NOTE — Research (Signed)
Renee was here for his final visit for the [name] study. This was his 30 day followup visit. He had had a sore throat prior to the last visit and had been to his PCP who prescribed MMW. He said he did end up taking that for the sore throat which persisted and finished taking that about 2 weeks ago. He denies any other problems or new medications. He also screened for the SOLAR study today. Informed consent was obtained and all questions answered. He had been given a copy of the consent at the last visit which he was able to review prior to this visit. He was able to explain the purpose of the study and other pt. related details. He is willing to proceed. He knows he may be randomized to continue Biktarvy for anopther year or go direct to injections of Cabenuva. An Ekg was done as well as lab work, reviewed medical history and he was given a clincard. Entry is planned for 6/4 if he is eligible

## 2019-12-15 ENCOUNTER — Encounter: Payer: Self-pay | Admitting: Infectious Disease

## 2019-12-15 LAB — CD4/CD8 (T-HELPER/T-SUPPRESSOR CELL)
CD4 % Helper T Cell: 40.5
CD4 Count: 899

## 2019-12-16 ENCOUNTER — Encounter: Payer: Self-pay | Admitting: Infectious Disease

## 2019-12-16 LAB — HIV-1 RNA QUANT-NO REFLEX-BLD: HIV 1 RNA UltraQuant: NOT DETECTED

## 2019-12-24 ENCOUNTER — Encounter (INDEPENDENT_AMBULATORY_CARE_PROVIDER_SITE_OTHER): Payer: BC Managed Care – PPO | Admitting: *Deleted

## 2019-12-24 ENCOUNTER — Other Ambulatory Visit: Payer: Self-pay

## 2019-12-24 DIAGNOSIS — Z006 Encounter for examination for normal comparison and control in clinical research program: Secondary | ICD-10-CM

## 2019-12-24 LAB — COMPREHENSIVE METABOLIC PANEL
AG Ratio: 2.4 (calc) (ref 1.0–2.5)
ALT: 30 U/L (ref 9–46)
AST: 49 U/L — ABNORMAL HIGH (ref 10–40)
Albumin: 4.5 g/dL (ref 3.6–5.1)
Alkaline phosphatase (APISO): 50 U/L (ref 36–130)
BUN: 13 mg/dL (ref 7–25)
CO2: 28 mmol/L (ref 20–32)
Calcium: 9.5 mg/dL (ref 8.6–10.3)
Chloride: 108 mmol/L (ref 98–110)
Creat: 1.05 mg/dL (ref 0.60–1.35)
Globulin: 1.9 g/dL (calc) (ref 1.9–3.7)
Glucose, Bld: 98 mg/dL (ref 65–99)
Potassium: 4.1 mmol/L (ref 3.5–5.3)
Sodium: 139 mmol/L (ref 135–146)
Total Bilirubin: 0.5 mg/dL (ref 0.2–1.2)
Total Protein: 6.4 g/dL (ref 6.1–8.1)

## 2019-12-24 NOTE — Research (Signed)
David Hoffman came in today to repeat a Cmet for the screening labs for SOLAR. They were drawn at 8:15. He says he has refrained from alcohol and working out for the past several days. Entry for the study is planned for next Friday.

## 2019-12-29 ENCOUNTER — Telehealth: Payer: Self-pay

## 2019-12-29 MED FILL — BIKTARVY 50-200-25 MG TABS: 50-200-25 | 30 days supply | Qty: 30 | Fill #2

## 2019-12-29 NOTE — Telephone Encounter (Signed)
COVID-19 Pre-Screening Questions:12/29/19  Do you currently have a fever (>100 F), chills or unexplained body aches?NO  Are you currently experiencing new cough, shortness of breath, sore throat, runny nose?NO .  Have you recently travelled outside the state of [place] in the last 14 days? NO .  Have you been in contact with someone that is currently pending confirmation of Covid19 testing or has been confirmed to have the Covid19 virus? NO  **If the patient answers NO to ALL questions -  advise the patient to please call the clinic before coming to the office should any symptoms develop.     

## 2019-12-30 ENCOUNTER — Other Ambulatory Visit: Payer: Self-pay

## 2019-12-30 ENCOUNTER — Other Ambulatory Visit: Payer: BC Managed Care – PPO

## 2019-12-30 DIAGNOSIS — B20 Human immunodeficiency virus [HIV] disease: Secondary | ICD-10-CM

## 2019-12-31 LAB — T-HELPER CELL (CD4) - (RCID CLINIC ONLY)
CD4 % Helper T Cell: 38 % (ref 33–65)
CD4 T Cell Abs: 1097 /uL (ref 400–1790)

## 2020-01-01 ENCOUNTER — Other Ambulatory Visit: Payer: Self-pay

## 2020-01-01 ENCOUNTER — Encounter (INDEPENDENT_AMBULATORY_CARE_PROVIDER_SITE_OTHER): Payer: Self-pay | Admitting: *Deleted

## 2020-01-01 VITALS — BP 114/69 | HR 65 | Temp 98.1°F | Wt 153.5 lb

## 2020-01-01 DIAGNOSIS — Z006 Encounter for examination for normal comparison and control in clinical research program: Secondary | ICD-10-CM

## 2020-01-01 LAB — CBC WITH DIFFERENTIAL/PLATELET
Absolute Monocytes: 794 cells/uL (ref 200–950)
Basophils Absolute: 48 cells/uL (ref 0–200)
Basophils Relative: 0.7 %
Eosinophils Absolute: 214 cells/uL (ref 15–500)
Eosinophils Relative: 3.1 %
HCT: 46.3 % (ref 38.5–50.0)
Hemoglobin: 15.6 g/dL (ref 13.2–17.1)
Lymphs Abs: 2905 cells/uL (ref 850–3900)
MCH: 29.8 pg (ref 27.0–33.0)
MCHC: 33.7 g/dL (ref 32.0–36.0)
MCV: 88.4 fL (ref 80.0–100.0)
MPV: 10.6 fL (ref 7.5–12.5)
Monocytes Relative: 11.5 %
Neutro Abs: 2939 cells/uL (ref 1500–7800)
Neutrophils Relative %: 42.6 %
Platelets: 268 10*3/uL (ref 140–400)
RBC: 5.24 10*6/uL (ref 4.20–5.80)
RDW: 12.3 % (ref 11.0–15.0)
Total Lymphocyte: 42.1 %
WBC: 6.9 10*3/uL (ref 3.8–10.8)

## 2020-01-01 LAB — COMPLETE METABOLIC PANEL WITH GFR
AG Ratio: 2.1 (calc) (ref 1.0–2.5)
ALT: 27 U/L (ref 9–46)
AST: 24 U/L (ref 10–40)
Albumin: 4.6 g/dL (ref 3.6–5.1)
Alkaline phosphatase (APISO): 55 U/L (ref 36–130)
BUN: 10 mg/dL (ref 7–25)
CO2: 26 mmol/L (ref 20–32)
Calcium: 9.5 mg/dL (ref 8.6–10.3)
Chloride: 106 mmol/L (ref 98–110)
Creat: 1.1 mg/dL (ref 0.60–1.35)
GFR, Est African American: 106 mL/min/{1.73_m2} (ref >=60)
GFR, Est Non African American: 92 mL/min/{1.73_m2} (ref >=60)
Globulin: 2.2 g/dL (calc) (ref 1.9–3.7)
Glucose, Bld: 95 mg/dL (ref 65–99)
Potassium: 4.4 mmol/L (ref 3.5–5.3)
Sodium: 139 mmol/L (ref 135–146)
Total Bilirubin: 0.5 mg/dL (ref 0.2–1.2)
Total Protein: 6.8 g/dL (ref 6.1–8.1)

## 2020-01-01 LAB — HIV-1 RNA QUANT-NO REFLEX-BLD
HIV 1 RNA Quant: 74 copies/mL — ABNORMAL HIGH
HIV-1 RNA Quant, Log: 1.87 Log copies/mL — ABNORMAL HIGH

## 2020-01-01 LAB — RPR TITER: RPR Titer: 1:2 {titer} — ABNORMAL HIGH

## 2020-01-01 LAB — FLUORESCENT TREPONEMAL AB(FTA)-IGG-BLD: Fluorescent Treponemal ABS: REACTIVE — AB

## 2020-01-01 LAB — RPR: RPR Ser Ql: REACTIVE — AB

## 2020-01-01 MED ORDER — STUDY - SOLAR - BICTEGRAVIR/EMTRICITABINE/TENOFOVIR ALFENAMIDE (BIKTARVY) 50-200-25MG TABLET (PI-VAN DAM): 1.0000 | ORAL_TABLET | Freq: Every day | ORAL | Status: DC

## 2020-01-01 NOTE — Research (Signed)
David Hoffman is here for his SOLAR entry visit. After verifying his eligibility he was randomized to the Biktarvy arm. He understands that he will be able to move to the injectables at the of the maintenance phase in year as long as his VL remains undetectable. He was really hoping to be randomized to the injectables but is glad that he can transition to them later in the study. No new complaints or medications. Open-label Biktarvy dispensed. He will return on July for his next study visit.

## 2020-01-13 ENCOUNTER — Other Ambulatory Visit (HOSPITAL_COMMUNITY)
Admission: RE | Admit: 2020-01-13 | Discharge: 2020-01-13 | Disposition: A | Discharge status: Home / Self Care | Payer: BC Managed Care – PPO | Source: Ambulatory Visit | Attending: Infectious Disease | Admitting: Infectious Disease

## 2020-01-13 ENCOUNTER — Ambulatory Visit (INDEPENDENT_AMBULATORY_CARE_PROVIDER_SITE_OTHER): Payer: BC Managed Care – PPO | Admitting: Infectious Disease

## 2020-01-13 ENCOUNTER — Encounter: Payer: Self-pay | Admitting: Infectious Disease

## 2020-01-13 ENCOUNTER — Other Ambulatory Visit: Payer: Self-pay

## 2020-01-13 VITALS — BP 130/73 | HR 83 | Temp 97.6°F | Ht 66.0 in | Wt 152.0 lb

## 2020-01-13 DIAGNOSIS — A549 Gonococcal infection, unspecified: Secondary | ICD-10-CM | POA: Diagnosis not present

## 2020-01-13 DIAGNOSIS — B20 Human immunodeficiency virus [HIV] disease: Secondary | ICD-10-CM | POA: Insufficient documentation

## 2020-01-13 LAB — CD4/CD8 (T-HELPER/T-SUPPRESSOR CELL)
CD4 Count: 888
CD4%: 37
CD8 % Suppressor T Cell: 33
CD8: 798
HIV 1 RNA Quant: <50

## 2020-01-13 NOTE — Progress Notes (Signed)
Subjective:   Chief complaint: For HIV disease on medications  Patient ID: David Hoffman, male    DOB: May 28, 1991, 29 y.o.   MRN: [id number]  HPI  David Hoffman is a 29 year old African-American man living with HIV who is enrolled in 12 study, currently in the open label phase on Biktarvy with viral load less than 40.  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  Since I last saw him we enrolled him into the SOL AR study which is a study of either continuing Biktarvy versus immediate switch to CAB EN [facility].  Those in the Biktarvy arm can roll over to see Cabenvua in 1 years time.  He was randomized to remain on Biktarvy is receiving Biktarvy through the study and also getting labs to the study   To be tested for STIs at genital and extra genital sites.  His syphilis titer was 1-2 when checked in the study   Past Medical History:  Diagnosis Date  . Abdominal pain 07/08/2019  . Cervical lymphadenopathy 10/26/2015  . Exudative pharyngitis 01/19/2015  . Gastroenteritis presumed infectious 10/26/2015  . Gonorrhea 01/19/2015  . History of gonorrhea   . HIV disease [facility]) march 2016 dx  . Oral lesion 10/26/2015  . 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 Tinea pedis     History reviewed. No pertinent surgical history.  Family History  Problem Relation Age of Onset  . Diabetes Mother   . Schizophrenia Maternal Grandmother       Social History   Socioeconomic History  . Marital status: Single    Spouse name: Not on file  . Number of children: Not on file  . Years of education: Not on file  . Highest education level: Not on file  Occupational History  . Not on file  Tobacco Use  . Smoking status: Current Every Day Smoker    Types: Cigars    Last attempt to quit: 11/09/2014    Years since quitting: 5.1  . Smokeless tobacco: Never Used  Substance and Sexual Activity  . Alcohol use: Yes    Alcohol/week: 0.0 standard drinks    Comment:  rare use  . Drug use: Yes    Types: Marijuana    Comment: daily  . Sexual activity: Yes    Partners: Male    Comment: offered condoms 06/2019  Other Topics Concern  . Not on file  Social History Narrative  . Not on file   Social Determinants of Health   Financial Resource Strain:   . Difficulty of Paying Living Expenses:   Food Insecurity:   . Worried About Programme researcher, broadcasting/film/video in the Last Year:   . Barista in the Last Year:   Transportation Needs:   . Freight forwarder (Medical):   Marland Kitchen Lack of Transportation (Non-Medical):   Physical Activity:   . Days of Exercise per Week:   . Minutes of Exercise per Session:   Stress:   . Feeling of Stress :   Social Connections:   . Frequency of Communication with Friends and Family:   . Frequency of Social Gatherings with Friends and Family:   . Attends Religious Services:   . Active Member of Clubs or Organizations:   . Attends Banker Meetings:   Marland Kitchen Marital Status:     No Known Allergies   Current Outpatient Medications:  .  Study - SOLAR - bictegravir/emtricitabine/tenofovir alafenamide (BIKTARVY) 50-200-25 mg tablet (PI-[place]), Take  1 tablet by mouth daily., Disp:  , Rfl:    Review of Systems  Constitutional: Negative for chills and fever.  HENT: Negative for congestion and sore throat.   Eyes: Negative for photophobia.  Respiratory: Negative for cough, shortness of breath and wheezing.   Cardiovascular: Negative for chest pain, palpitations and leg swelling.  Gastrointestinal: Negative for blood in stool, constipation, diarrhea, nausea and vomiting.  Genitourinary: Negative for dysuria, flank pain and hematuria.  Musculoskeletal: Negative for back pain and myalgias.  Skin: Negative for rash.  Neurological: Negative for dizziness, weakness and headaches.  Hematological: Does not bruise/bleed easily.  Psychiatric/Behavioral: Negative for decreased concentration, dysphoric mood, hallucinations and  suicidal ideas. The patient is not nervous/anxious and is not hyperactive.        Objective:   Physical Exam Constitutional:      General: He is not in acute distress.    Appearance: Normal appearance. He is well-developed. He is not ill-appearing or diaphoretic.  HENT:     Head: Normocephalic and atraumatic.     Right Ear: Hearing and external ear normal.     Left Ear: Hearing and external ear normal.     Nose: No nasal deformity or rhinorrhea.  Eyes:     General: No scleral icterus.    Extraocular Movements: Extraocular movements intact.     Conjunctiva/sclera: Conjunctivae normal.     Right eye: Right conjunctiva is not injected.     Left eye: Left conjunctiva is not injected.  Neck:     Vascular: No JVD.  Cardiovascular:     Rate and Rhythm: Normal rate and regular rhythm.     Heart sounds: S1 normal and S2 normal.  Pulmonary:     Effort: Pulmonary effort is normal. No respiratory distress.  Abdominal:     General: Abdomen is flat. There is no distension.     Palpations: Abdomen is soft. There is no mass.     Tenderness: There is no abdominal tenderness.  Genitourinary:    Penis: No paraphimosis.      Testes: Normal.  Musculoskeletal:        General: Normal range of motion.     Right shoulder: Normal.     Left shoulder: Normal.     Cervical back: Normal range of motion and neck supple.     Right hip: Normal.     Left hip: Normal.     Right knee: Normal.     Left knee: Normal.  Lymphadenopathy:     Head:     Right side of head: No submandibular, preauricular or posterior auricular adenopathy.     Left side of head: No submandibular, preauricular or posterior auricular adenopathy.     Cervical: No cervical adenopathy.     Right cervical: No superficial or deep cervical adenopathy.    Left cervical: No superficial or deep cervical adenopathy.  Skin:    General: Skin is warm and dry.     Coloration: Skin is not pale.     Findings: No abrasion, bruising,  ecchymosis, erythema, lesion or rash.     Nails: There is no clubbing.  Neurological:     General: No focal deficit present.     Mental Status: He is alert and oriented to person, place, and time.     Sensory: No sensory deficit.     Coordination: Coordination normal.     Gait: Gait normal.  Psychiatric:        Attention and Perception: He is attentive.  Mood and Affect: Mood normal.        Speech: Speech normal.        Behavior: Behavior normal. Behavior is cooperative.        Thought Content: Thought content normal.        Judgment: Judgment normal.           Assessment & Plan:   [id number] through study, turn to clinic in 6 months   History of disseminated gonococcal infection check GC chlamydia oropharynx rectum and urine  For Covid vaccination given information on where to get that.   Abdominal pain: Could be potentially prostatitis will check a urine culture.

## 2020-01-14 LAB — CYTOLOGY, (ORAL, ANAL, URETHRAL) ANCILLARY ONLY
Chlamydia: NEGATIVE
Chlamydia: NEGATIVE
Comment: NEGATIVE
Comment: NEGATIVE
Comment: NORMAL
Comment: NORMAL
Neisseria Gonorrhea: NEGATIVE
Neisseria Gonorrhea: NEGATIVE

## 2020-01-14 LAB — URINE CYTOLOGY ANCILLARY ONLY
Chlamydia: NEGATIVE
Comment: NEGATIVE
Comment: NORMAL
Neisseria Gonorrhea: NEGATIVE

## 2020-02-26 ENCOUNTER — Encounter: Payer: BC Managed Care – PPO | Admitting: *Deleted

## 2020-02-29 ENCOUNTER — Encounter (INDEPENDENT_AMBULATORY_CARE_PROVIDER_SITE_OTHER): Payer: Self-pay | Admitting: *Deleted

## 2020-02-29 ENCOUNTER — Other Ambulatory Visit: Payer: Self-pay

## 2020-02-29 VITALS — BP 145/75 | HR 63 | Temp 98.0°F | Wt 154.2 lb

## 2020-02-29 DIAGNOSIS — Z006 Encounter for examination for normal comparison and control in clinical research program: Secondary | ICD-10-CM

## 2020-02-29 NOTE — Research (Signed)
David Hoffman is here for his month 2 SOLAR study visit. No new complaints or medications. Verbalized excellent adherence with his study product. He has not received his Covid-19 vaccines but plans to get them soon. He will return in September for his study visit.

## 2020-03-21 ENCOUNTER — Other Ambulatory Visit: Payer: Self-pay

## 2020-03-21 ENCOUNTER — Encounter (HOSPITAL_COMMUNITY): Payer: Self-pay

## 2020-03-21 ENCOUNTER — Emergency Department (HOSPITAL_COMMUNITY)
Admission: EM | Admit: 2020-03-21 | Discharge: 2020-03-21 | Disposition: A | Discharge status: Home / Self Care | Payer: BC Managed Care – PPO | Attending: Emergency Medicine | Admitting: Emergency Medicine

## 2020-03-21 DIAGNOSIS — Z5321 Procedure and treatment not carried out due to patient leaving prior to being seen by health care provider: Secondary | ICD-10-CM | POA: Diagnosis not present

## 2020-03-21 DIAGNOSIS — R600 Localized edema: Secondary | ICD-10-CM | POA: Diagnosis present

## 2020-03-21 MED ORDER — TETRACAINE HCL 0.5 % OP SOLN: 2.0000 [drp] | Freq: Once | OPHTHALMIC | Status: DC

## 2020-03-21 MED ORDER — FLUORESCEIN SODIUM 1 MG OP STRP: 1.0000 | ORAL_STRIP | Freq: Once | OPHTHALMIC | Status: DC

## 2020-03-21 NOTE — ED Triage Notes (Signed)
Patient arrived stating today when he woke up his eye was swollen and painful. Declines any injury.

## 2020-03-21 NOTE — ED Notes (Signed)
Patient declines an ice pack at this time

## 2020-04-22 ENCOUNTER — Other Ambulatory Visit: Payer: Self-pay

## 2020-04-22 ENCOUNTER — Encounter (INDEPENDENT_AMBULATORY_CARE_PROVIDER_SITE_OTHER): Payer: BC Managed Care – PPO | Admitting: *Deleted

## 2020-04-22 VITALS — Temp 97.7°F | Wt 153.4 lb

## 2020-04-22 DIAGNOSIS — Z006 Encounter for examination for normal comparison and control in clinical research program: Secondary | ICD-10-CM

## 2020-04-22 LAB — COMPREHENSIVE METABOLIC PANEL
Albumin: 4.6 (ref 3.5–5.0)
GFR calc Af Amer: 1.26

## 2020-04-22 LAB — HEPATIC FUNCTION PANEL
ALT: 27 (ref 10–40)
AST: 30 (ref 14–40)
Alkaline Phosphatase: 77 (ref 25–125)
Bilirubin, Total: 0.4

## 2020-04-22 LAB — BASIC METABOLIC PANEL
BUN: 12 (ref 4–21)
CO2: 25 — AB (ref 13–22)
Chloride: 103 (ref 99–108)
Creatinine: 1.3 (ref 0.6–1.3)
Glucose: 82
Potassium: 4.3 (ref 3.4–5.3)
Sodium: 140 (ref 137–147)

## 2020-04-22 LAB — CBC AND DIFFERENTIAL
HCT: 46 (ref 41–53)
Hemoglobin: 15.4 (ref 13.5–17.5)
Neutrophils Absolute: 4.03
Platelets: 360 (ref 150–399)
WBC: 7.4

## 2020-04-22 LAB — CBC: RBC: 5.2 — AB (ref 3.87–5.11)

## 2020-04-22 NOTE — Research (Signed)
David Hoffman was here for his month 4 visit for the SOLAR study. He denies any new health issues or medications. His mother had a stroke a couple weeks ago while she was ill with Covid and is still recovering in the hospital and he has been dealing with that. I did offer counseling with Marylu Lund since this has been very stressful for him. He will be returning in 2 months.

## 2020-04-26 LAB — CD4/CD8 (T-HELPER/T-SUPPRESSOR CELL)
CD4 % Helper T Cell: 39
CD4 Count: 1111
HIV 1 RNA UltraQuant: <50

## 2020-04-26 LAB — HIV-1 RNA QUANT-NO REFLEX-BLD: HIV1 Copies/mL: <50

## 2020-05-18 ENCOUNTER — Encounter: Payer: Self-pay | Admitting: Infectious Disease

## 2020-06-17 ENCOUNTER — Encounter (INDEPENDENT_AMBULATORY_CARE_PROVIDER_SITE_OTHER): Payer: BC Managed Care – PPO | Admitting: *Deleted

## 2020-06-17 ENCOUNTER — Other Ambulatory Visit: Payer: Self-pay

## 2020-06-17 VITALS — BP 125/78 | HR 70 | Temp 97.9°F | Wt 156.1 lb

## 2020-06-17 DIAGNOSIS — Z006 Encounter for examination for normal comparison and control in clinical research program: Secondary | ICD-10-CM

## 2020-06-17 NOTE — Research (Signed)
David Hoffman here today for his Month 6 SOLAR study visit. He received his 1st Moderna Covid-19 vaccine yesterday. States that he is having some mild body ached today. No other complaints verbalized. States that he did have some URI symptoms last week. Was seen at [facility]. Had a flu and covid test at that time, both were negative. Was treated with a Z-pak and 5 days of Prednisone. Symptoms have since resolved. Excellent adherence with his study medication. He will return in January for his next study visit.

## 2020-07-18 ENCOUNTER — Encounter: Payer: Self-pay | Admitting: Infectious Disease

## 2020-07-18 ENCOUNTER — Other Ambulatory Visit (HOSPITAL_COMMUNITY)
Admission: RE | Admit: 2020-07-18 | Discharge: 2020-07-18 | Disposition: A | Discharge status: Home / Self Care | Payer: BC Managed Care – PPO | Source: Ambulatory Visit | Attending: Infectious Disease | Admitting: Infectious Disease

## 2020-07-18 ENCOUNTER — Other Ambulatory Visit: Payer: Self-pay

## 2020-07-18 ENCOUNTER — Ambulatory Visit (INDEPENDENT_AMBULATORY_CARE_PROVIDER_SITE_OTHER): Payer: BC Managed Care – PPO | Admitting: Infectious Disease

## 2020-07-18 VITALS — BP 107/69 | HR 73 | Resp 16 | Ht 66.0 in | Wt 158.0 lb

## 2020-07-18 DIAGNOSIS — A549 Gonococcal infection, unspecified: Secondary | ICD-10-CM | POA: Insufficient documentation

## 2020-07-18 DIAGNOSIS — J069 Acute upper respiratory infection, unspecified: Secondary | ICD-10-CM | POA: Diagnosis present

## 2020-07-18 DIAGNOSIS — A5486 Gonococcal sepsis: Secondary | ICD-10-CM | POA: Diagnosis present

## 2020-07-18 DIAGNOSIS — B20 Human immunodeficiency virus [HIV] disease: Secondary | ICD-10-CM | POA: Insufficient documentation

## 2020-07-18 DIAGNOSIS — Z113 Encounter for screening for infections with a predominantly sexual mode of transmission: Secondary | ICD-10-CM | POA: Diagnosis present

## 2020-07-18 HISTORY — DX: Encounter for screening for infections with a predominantly sexual mode of transmission: Z11.3

## 2020-07-18 NOTE — Progress Notes (Signed)
Subjective:   Chief complaint: For HIV disease on medications  Patient ID: David Hoffman, male    DOB: 03-Feb-1991, 29 y.o.   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   Is here for routine clinic visit.  We will test him for gonorrhea and chlamydia at genital necks or genital sites as well as test a syphilis test.  He is doing well and has had vaccines for COVID-19 he refuses influenza shot  He sounds of had a viral illness several weeks ago but tested negative for COVID-19   Past Medical History:  Diagnosis Date  . Abdominal pain 07/08/2019  . Cervical lymphadenopathy 10/26/2015  . Exudative pharyngitis 01/19/2015  . Gastroenteritis presumed infectious 10/26/2015  . Gonorrhea 01/19/2015  . History of gonorrhea   . HIV disease [facility]) march 2016 dx  . Oral lesion 10/26/2015  . 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 Routine screening for STI (sexually transmitted infection) 07/18/2020  . Tinea pedis     History reviewed. No pertinent surgical history.  Family History  Problem Relation Age of Onset  . Diabetes Mother   . Schizophrenia Maternal Grandmother       Social History   Socioeconomic History  . Marital status: Single    Spouse name: Not on file  . Number of children: Not on file  . Years of education: Not on file  . Highest education level: Not on file  Occupational History  . Not on file  Tobacco Use  . Smoking status: Current Every Day Smoker    Types: Cigars    Last attempt to quit: 11/09/2014    Years since quitting: 5.6  . Smokeless tobacco: Never Used  Substance and Sexual Activity  . Alcohol use: Yes    Alcohol/week: 0.0 standard drinks    Comment: rare use  . Drug use: Yes    Types: Marijuana    Comment: daily  . Sexual activity: Yes     Partners: Male    Comment: offered condoms 06/2019  Other Topics Concern  . Not on file  Social History Narrative  . Not on file   Social Determinants of Health   Financial Resource Strain: Not on file  Food Insecurity: Not on file  Transportation Needs: Not on file  Physical Activity: Not on file  Stress: Not on file  Social Connections: Not on file    No Known Allergies   Current Outpatient Medications:  .  Study - SOLAR - bictegravir/emtricitabine/tenofovir alafenamide (BIKTARVY) 50-200-25 mg tablet (PI-[place]), Take 1 tablet by mouth daily., Disp:  , Rfl:    Review of Systems  Constitutional: Negative for chills and fever.  HENT: Positive for sinus pressure. Negative for congestion and sore throat.   Eyes: Negative for photophobia.  Respiratory: Positive for cough. Negative for shortness of breath and wheezing.   Cardiovascular: Negative for chest pain, palpitations and leg swelling.  Gastrointestinal: Negative for blood in stool, constipation, diarrhea, nausea and vomiting.  Genitourinary: Negative for dysuria, flank pain and hematuria.  Musculoskeletal: Negative for back pain and myalgias.  Skin: Negative for rash.  Neurological: Negative for dizziness, weakness and headaches.  Hematological: Does not bruise/bleed easily.  Psychiatric/Behavioral: Negative for decreased concentration, dysphoric mood, hallucinations and suicidal ideas. The patient is not nervous/anxious and is not hyperactive.  Objective:   Physical Exam Constitutional:      General: He is not in acute distress.    Appearance: Normal appearance. He is well-developed. He is not ill-appearing or diaphoretic.  HENT:     Head: Normocephalic and atraumatic.     Right Ear: Hearing and external ear normal.     Left Ear: Hearing and external ear normal.     Nose: No nasal deformity or rhinorrhea.  Eyes:     General: No scleral icterus.    Extraocular Movements: Extraocular movements intact.      Conjunctiva/sclera: Conjunctivae normal.     Right eye: Right conjunctiva is not injected.     Left eye: Left conjunctiva is not injected.  Neck:     Vascular: No JVD.  Cardiovascular:     Rate and Rhythm: Normal rate and regular rhythm.     Heart sounds: S1 normal and S2 normal.  Pulmonary:     Effort: Pulmonary effort is normal. No respiratory distress.  Abdominal:     General: Abdomen is flat. There is no distension.     Palpations: Abdomen is soft. There is no mass.     Tenderness: There is no abdominal tenderness.  Genitourinary:    Penis: No paraphimosis.      Testes: Normal.  Musculoskeletal:        General: Normal range of motion.     Right shoulder: Normal.     Left shoulder: Normal.     Cervical back: Normal range of motion and neck supple.     Right hip: Normal.     Left hip: Normal.     Right knee: Normal.     Left knee: Normal.  Lymphadenopathy:     Head:     Right side of head: No submandibular, preauricular or posterior auricular adenopathy.     Left side of head: No submandibular, preauricular or posterior auricular adenopathy.     Cervical: No cervical adenopathy.     Right cervical: No superficial or deep cervical adenopathy.    Left cervical: No superficial or deep cervical adenopathy.  Skin:    General: Skin is warm and dry.     Coloration: Skin is not pale.     Findings: No abrasion, bruising, ecchymosis, erythema, lesion or rash.     Nails: There is no clubbing.  Neurological:     General: No focal deficit present.     Mental Status: He is alert and oriented to person, place, and time. Mental status is at baseline.     Sensory: No sensory deficit.     Coordination: Coordination normal.     Gait: Gait normal.  Psychiatric:        Attention and Perception: He is attentive.        Mood and Affect: Mood normal.        Speech: Speech normal.        Behavior: Behavior normal. Behavior is cooperative.        Thought Content: Thought content normal.         Judgment: Judgment normal.           Assessment & Plan:   VPX:[id number] through study, turn to clinic in 6 months  I also asked him to refer friends of his who are uncertain of their HIV status to our pURPOSE 2 study for testing for HIV and if negative consideration of this PrEP study   History of disseminated gonococcal infection check GC chlamydia oropharynx rectum and urine  URi resolved.

## 2020-07-19 LAB — CYTOLOGY, (ORAL, ANAL, URETHRAL) ANCILLARY ONLY
Chlamydia: POSITIVE — AB
Chlamydia: POSITIVE — AB
Comment: NEGATIVE
Comment: NEGATIVE
Comment: NORMAL
Comment: NORMAL
Neisseria Gonorrhea: NEGATIVE
Neisseria Gonorrhea: NEGATIVE

## 2020-07-19 LAB — RPR: RPR Ser Ql: REACTIVE — AB

## 2020-07-19 LAB — URINE CYTOLOGY ANCILLARY ONLY
Chlamydia: NEGATIVE
Comment: NEGATIVE
Comment: NORMAL
Neisseria Gonorrhea: NEGATIVE

## 2020-07-19 LAB — FLUORESCENT TREPONEMAL AB(FTA)-IGG-BLD: Fluorescent Treponemal ABS: REACTIVE — AB

## 2020-07-19 LAB — RPR TITER: RPR Titer: 1:2 {titer} — ABNORMAL HIGH

## 2020-07-20 ENCOUNTER — Other Ambulatory Visit: Payer: Self-pay

## 2020-07-20 ENCOUNTER — Telehealth: Payer: Self-pay

## 2020-07-20 MED ORDER — DOXYCYCLINE HYCLATE 100 MG PO TABS: 100.0000 mg | ORAL_TABLET | Freq: Two times a day (BID) | ORAL | 0 refills | Status: AC

## 2020-07-20 NOTE — Telephone Encounter (Signed)
Relayed positive chlamydia results and lab results and partners will need to be tested and treated Patient verbalized understanding. Per Dr. Daiva Eves patient will need to be treated with doxycycline 100 mg BID x 21 days. Rx has been sent to the patient's pharmacy.  Danikah Budzik T Pricilla Loveless

## 2020-07-20 NOTE — Telephone Encounter (Signed)
-----   Message from Randall Hiss, MD sent at 07/20/2020  8:48 AM EST ----- He needs doxycycline x2 weeks partners need to be tested and treated

## 2020-07-20 NOTE — Telephone Encounter (Signed)
l attempted to reach out to the patient to relay lab results. Patient did not answer and I left a voicemail for patient to call back.

## 2020-08-12 ENCOUNTER — Other Ambulatory Visit: Payer: Self-pay

## 2020-08-12 ENCOUNTER — Encounter (INDEPENDENT_AMBULATORY_CARE_PROVIDER_SITE_OTHER): Payer: BC Managed Care – PPO | Admitting: *Deleted

## 2020-08-12 DIAGNOSIS — Z006 Encounter for examination for normal comparison and control in clinical research program: Secondary | ICD-10-CM

## 2020-08-12 NOTE — Research (Signed)
David Hoffman was here for his month 8 SOLAR visit. He denies any current problems. He did test positive for Chlamydia in December and has been treated. His adherence has been good, with only missing 2 pills the past 2 months. He will be returning in 2 months.

## 2020-09-21 ENCOUNTER — Telehealth: Payer: Self-pay

## 2020-09-21 NOTE — Telephone Encounter (Signed)
[facility] Patient Advocate Encounter   Received notification from [employer] that prior authorization for David Hoffman is required.   PA submitted on 09/21/20 Key [id number] Status is pending    [facility] Clinic will continue to follow.   Clearance Coots, CPhT Specialty Pharmacy Patient [facility] for Infectious Disease Phone: [phone] Fax:  [phone]

## 2020-09-23 ENCOUNTER — Telehealth: Payer: Self-pay

## 2020-09-23 NOTE — Telephone Encounter (Signed)
Thanks Donna!

## 2020-09-23 NOTE — Telephone Encounter (Signed)
[facility] Patient Advocate Encounter  Received notification from [employer] that the request for prior authorization for CABENUVA has been denied due to NON-Preferred Drug Coverage Review.      Clearance Coots, CPhT Specialty Pharmacy Patient [facility] for Infectious Disease Phone: [phone] Fax:  [phone]

## 2020-10-10 ENCOUNTER — Encounter (INDEPENDENT_AMBULATORY_CARE_PROVIDER_SITE_OTHER): Payer: Self-pay | Admitting: *Deleted

## 2020-10-10 ENCOUNTER — Other Ambulatory Visit: Payer: Self-pay

## 2020-10-10 DIAGNOSIS — Z006 Encounter for examination for normal comparison and control in clinical research program: Secondary | ICD-10-CM

## 2020-10-10 NOTE — Research (Signed)
David Hoffman was here for his month 10 visit for Solar study. He is still on Biktarvy currently and will be switched to [place] on study within a few months. He denies any new problems.

## 2020-10-13 LAB — HEPATIC FUNCTION PANEL
ALT: 18 (ref 10–40)
AST: 16 (ref 14–40)
Alkaline Phosphatase: 89 (ref 25–125)
Bilirubin, Total: 0.2

## 2020-10-13 LAB — BASIC METABOLIC PANEL
BUN: 10 (ref 4–21)
CO2: 26 — AB (ref 13–22)
Chloride: 105 (ref 99–108)
Creatinine: 1.1 (ref 0.6–1.3)
Glucose: 92
Potassium: 4 (ref 3.4–5.3)
Sodium: 144 (ref 137–147)

## 2020-10-13 LAB — CBC AND DIFFERENTIAL
HCT: 46 (ref 41–53)
Hemoglobin: 15.7 (ref 13.5–17.5)
Neutrophils Absolute: 2.33
Platelets: 325 (ref 150–399)
WBC: 6.4

## 2020-10-13 LAB — CD4/CD8 (T-HELPER/T-SUPPRESSOR CELL)
CD4 T Cell Abs: 1355
CD4%: 44

## 2020-10-13 LAB — HIV-1 RNA QUANT-NO REFLEX-BLD
Copies/mL: <50
Log copies/ml (ULTRA): <1.7

## 2020-10-13 LAB — LIPASE: Lipase: 37

## 2020-10-13 LAB — COMPREHENSIVE METABOLIC PANEL: GFR calc Af Amer: 1.13

## 2020-10-13 LAB — CBC: RBC: 5.4 — AB (ref 3.87–5.11)

## 2020-11-10 ENCOUNTER — Other Ambulatory Visit (HOSPITAL_COMMUNITY): Payer: Self-pay

## 2020-11-16 ENCOUNTER — Telehealth: Payer: Self-pay

## 2020-11-16 NOTE — Telephone Encounter (Signed)
[facility] Patient Advocate Encounter   Received notification from [employer] that prior authorization appeal for Renaldo Harrison is required.     Clinical Appeals Department  Phone# [phone] Sent fax to [phone]   Status is pending    [facility] will continue to follow.   Clearance Coots, CPhT Specialty Pharmacy Patient [facility] for Infectious Disease Phone: [phone] Fax:  [phone]

## 2020-11-21 ENCOUNTER — Other Ambulatory Visit (HOSPITAL_COMMUNITY): Payer: Self-pay

## 2020-11-29 ENCOUNTER — Other Ambulatory Visit (HOSPITAL_COMMUNITY): Payer: Self-pay

## 2020-11-29 ENCOUNTER — Telehealth: Payer: Self-pay

## 2020-11-29 NOTE — Telephone Encounter (Signed)
[facility] Patient Advocate Encounter  Prior Authorization for David Hoffman has been approved.    PA# [id number] Effective dates: 10/22/20 through 11/28/21  Patients co-pay is $2343.42.   I was able to get patient a Copay Coupon Card to make copay $0.00  BIN: [id number] PCN: [id number] [id number] Patient ID [id number]       [facility] will continue to follow.  Clearance Coots, CPhT Specialty Pharmacy Patient [facility] for Infectious Disease Phone: [phone] Fax:  [phone]

## 2020-12-05 ENCOUNTER — Encounter (INDEPENDENT_AMBULATORY_CARE_PROVIDER_SITE_OTHER): Payer: BC Managed Care – PPO | Admitting: *Deleted

## 2020-12-05 ENCOUNTER — Other Ambulatory Visit: Payer: Self-pay

## 2020-12-05 ENCOUNTER — Other Ambulatory Visit (HOSPITAL_COMMUNITY): Payer: Self-pay

## 2020-12-05 ENCOUNTER — Encounter: Payer: Self-pay | Admitting: *Deleted

## 2020-12-05 VITALS — BP 122/72 | HR 86 | Temp 98.6°F | Resp 16 | Wt 150.8 lb

## 2020-12-05 DIAGNOSIS — Z006 Encounter for examination for normal comparison and control in clinical research program: Secondary | ICD-10-CM

## 2020-12-05 DIAGNOSIS — B354 Tinea corporis: Secondary | ICD-10-CM

## 2020-12-05 DIAGNOSIS — B20 Human immunodeficiency virus [HIV] disease: Secondary | ICD-10-CM

## 2020-12-05 MED ORDER — CLOTRIMAZOLE 1 % EX CREA: 1.0000 "application " | TOPICAL_CREAM | Freq: Two times a day (BID) | CUTANEOUS | 0 refills | Status: DC

## 2020-12-05 NOTE — Patient Instructions (Signed)
Start clotrimaazole 1% cream apply twice daily x 6 weeks Wipe down gym equipment before and after use Wash clothes on hot water  Body Ringworm Body ringworm is an infection of the skin that often causes a ring-shaped rash. Body ringworm is also called tinea corporis. Body ringworm can affect any part of your skin. This condition is easily spread from person to person (is very contagious). What are the causes? This condition is caused by fungi called dermatophytes. The condition develops when these fungi grow out of control on the skin. You can get this condition if you touch a person or animal that has it. You can also get it if you share any items with an infected person or pet. These include:  Clothing, bedding, and towels.  Brushes or combs.  Gym equipment.  Any other object that has the fungus on it. What increases the risk? You are more likely to develop this condition if you:  Play sports that involve close physical contact, such as wrestling.  Sweat a lot.  Live in areas that are hot and humid.  Use public showers.  Have a weakened immune system. What are the signs or symptoms? Symptoms of this condition include:  Itchy, raised red spots and bumps.  Red scaly patches.  A ring-shaped rash. The rash may have: ? A clear center. ? Scales or red bumps at its center. ? Redness near its borders. ? Dry and scaly skin on or around it.   How is this diagnosed? This condition can usually be diagnosed with a skin exam. A skin scraping may be taken from the affected area and examined under a microscope to see if the fungus is present. How is this treated? This condition may be treated with:  An antifungal cream or ointment.  An antifungal shampoo.  Antifungal medicines. These may be prescribed if your ringworm: ? Is severe. ? Keeps coming back. ? Lasts a long time. Follow these instructions at home:  Take over-the-counter and prescription medicines only as told by  your health care provider.  If you were given an antifungal cream or ointment: ? Use it as told by your health care provider. ? Wash the infected area and dry it completely before applying the cream or ointment.  If you were given an antifungal shampoo: ? Use it as told by your health care provider. ? Leave the shampoo on your body for 3-5 minutes before rinsing.  While you have a rash: ? Wear loose clothing to stop clothes from rubbing and irritating it. ? Wash or change your bed sheets every night. ? Disinfect or throw out items that may be infected. ? Wash clothes and bed sheets in hot water. ? Wash your hands often with soap and water. If soap and water are not available, use hand sanitizer.  If your pet has the same infection, take your pet to see a veterinarian for treatment. How is this prevented?  Take a bath or shower every day and after every time you work out or play sports.  Dry your skin completely after bathing.  Wear sandals or shoes in public places and showers.  Change your clothes every day.  Wash athletic clothes after each use.  Do not share personal items with others.  Avoid touching red patches of skin on other people.  Avoid touching pets that have bald spots.  If you touch an animal that has a bald spot, wash your hands. Contact a health care provider if:  Your rash  continues to spread after 7 days of treatment.  Your rash is not gone in 4 weeks.  The area around your rash gets red, warm, tender, and swollen. Summary  Body ringworm is an infection of the skin that often causes a ring-shaped rash.  This condition is easily spread from person to person (is very contagious).  This condition may be treated with antifungal cream or ointment, antifungal shampoo, or antifungal medicines.  Take over-the-counter and prescription medicines only as told by your health care provider. This information is not intended to replace advice given to you by  your health care provider. Make sure you discuss any questions you have with your health care provider. Document Revised: 03/14/2018 Document Reviewed: 03/14/2018 Elsevier Patient Education  2021 [employer].

## 2020-12-05 NOTE — Progress Notes (Signed)
Subjective:    Patient ID: David Hoffman, male    DOB: 03/23/1991, 30 y.o.   MRN: [id number]  Chief Complaint  Patient presents with  . Research    SOLAR M12 BIK arm  . Rash    Back present x 1 month     HPI:  David Hoffman is a 30 y.o. male well controlled HIV-1 presents today for M 12 SOLAR study-in BIK arm. Adherent and tolerating medication well. See Source documentation.   Pt also complains of progressive annular kin lesions x 3 on left aspect of back x 1 month. Denies recent infections, fever, tic bite, arthralgias, myalgias, HA, abd pain, n/v/d/c, pruritis, discharge, tenderness. Reports observed suddenly after he had begun going to the gym regularly.  He works for [employer] and remains sweaty going in and out of car and homes.    No Known Allergies    Outpatient Medications Prior to Visit  Medication Sig Dispense Refill  . Study - SOLAR - bictegravir/emtricitabine/tenofovir alafenamide (BIKTARVY) 50-200-25 mg tablet (PI-[place]) Take 1 tablet by mouth daily.     No facility-administered medications prior to visit.     Past Medical History:  Diagnosis Date  . Abdominal pain 07/08/2019  . Cervical lymphadenopathy 10/26/2015  . Exudative pharyngitis 01/19/2015  . Gastroenteritis presumed infectious 10/26/2015  . Gonorrhea 01/19/2015  . History of gonorrhea   . HIV disease [facility]) march 2016 dx  . Oral lesion 10/26/2015  . 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 Routine screening for STI (sexually transmitted infection) 07/18/2020  . Tinea pedis      No past surgical history on file.     Review of Systems   see above Objective:    BP 122/72   Pulse 86   Temp 98.6 F (37 C)   Resp 16   Wt 150 lb 12.8 oz (68.4 kg)   BMI 24.34 kg/m  Nursing note and vital signs reviewed.  Physical Exam Vitals reviewed.  Constitutional:      Appearance: Normal appearance.  HENT:     Head: Normocephalic.  Eyes:     Extraocular Movements: Extraocular movements  intact.     Conjunctiva/sclera: Conjunctivae normal.     Pupils: Pupils are equal, round, and reactive to light.  Cardiovascular:     Rate and Rhythm: Normal rate and regular rhythm.  Pulmonary:     Effort: Pulmonary effort is normal.     Breath sounds: Normal breath sounds.  Musculoskeletal:     Cervical back: Normal range of motion and neck supple.  Lymphadenopathy:     Cervical: No cervical adenopathy.  Skin:    General: Skin is warm and dry.     Findings: Lesion and rash present.          Comments: Back: annular lesion x 3 measuring 5 cm in diameter with 2 satellite lesions 2-3 cm in diameter Circular lesions, hyperpigmented, scaling patch/plaque spreading centrifugally-without central clearing. Not tender, no discharge, no signs of secondary infection  Neurological:     General: No focal deficit present.  Psychiatric:        Mood and Affect: Mood normal.      Depression screen [facility] 2/9 10/15/2017 10/26/2015 02/01/2015 01/04/2015 11/30/2014  Decreased Interest 0 0 0 0 0  Down, Depressed, Hopeless 0 0 0 0 0  PHQ - 2 Score 0 0 0 0 0       Assessment & Plan:  SOLAR-12 m visit-ECG NSR  Dermatophyte infection back-clotrimazole twice daily x 6 weeks  Wipe down all gym equipment before and after use Wash clothes on warm sterile cycle Wipe down sweat when in car and change shirts if becoming sweaty throughout the day Follow up as needed.  Patient Active Problem List   Diagnosis Date Noted  . Routine screening for STI (sexually transmitted infection) 07/18/2020  . History of syphilis 10/29/2019  . Candidiasis of mouth 10/24/2019  . Abdominal pain 07/08/2019  . External hemorrhoid 05/13/2019  . Gastroenteritis presumed infectious 10/26/2015  . Oral lesion 10/26/2015  . Cervical lymphadenopathy 10/26/2015  . Cervical lymphadenitis   . Headache above the eye region   . Exudative pharyngitis 01/19/2015  . 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 Gonorrhea 01/19/2015  . 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 Polyarthritis 01/19/2015  . HIV disease (HCC) 11/30/2014  . Nausea without vomiting 11/30/2014     Problem List Items Addressed This Visit      Other   HIV disease (HCC)   Relevant Medications   clotrimazole (LOTRIMIN) 1 % cream    Other Visit Diagnoses    Examination of participant in clinical trial    -  Primary   Tinea corporis       Relevant Medications   clotrimazole (LOTRIMIN) 1 % cream       I am having Kal Vanvorst start on clotrimazole. I am also having him maintain his SOLAR bictegravir/emtricitabine/tenofovir alafenamide.   Meds ordered this encounter  Medications  . clotrimazole (LOTRIMIN) 1 % cream    Sig: Apply 1 application topically 2 (two) times daily.    Dispense:  30 g    Refill:  0    Order Specific Question:   Supervising Provider    Answer:   VAN DAM, CORNELIUS N [[id number]]     Follow-up: Return if symptoms worsen or fail to improve.

## 2020-12-08 LAB — LIPID PANEL
Cholesterol: 144 (ref 0–200)
HDL: 52 (ref 35–70)
LDL Cholesterol: 77
Triglycerides: 75 (ref 40–160)

## 2020-12-08 LAB — BASIC METABOLIC PANEL
BUN: 17 (ref 4–21)
CO2: 24 — AB (ref 13–22)
Chloride: 101 (ref 99–108)
Creatinine: 1.3 (ref 0.6–1.3)
Glucose: 97
Potassium: 4.2 (ref 3.4–5.3)
Sodium: 142 (ref 137–147)

## 2020-12-08 LAB — CD4/CD8 (T-HELPER/T-SUPPRESSOR CELL)
Absolute CD4: 923
CD4%: 43
CD8 % Suppressor T Cell: 32
CD8 T Cell Abs: 678

## 2020-12-08 LAB — HEPATIC FUNCTION PANEL
ALT: 24 (ref 10–40)
AST: 30 (ref 14–40)
Alkaline Phosphatase: 93 (ref 25–125)
Bilirubin, Total: 0.2

## 2020-12-08 LAB — CBC AND DIFFERENTIAL
HCT: 48 (ref 41–53)
Hemoglobin: 15.8 (ref 13.5–17.5)
Neutrophils Absolute: 6.66
Platelets: 362 (ref 150–399)
WBC: 9.1

## 2020-12-08 LAB — COMPREHENSIVE METABOLIC PANEL
Albumin: 5.2 — AB (ref 3.5–5.0)
GFR calc Af Amer: 1.27

## 2020-12-08 LAB — HIV-1 RNA QUANT-NO REFLEX-BLD
HIV-1 RNA Quant, Log: <1.7
HIV1 Copies/mL: <50

## 2020-12-08 LAB — CBC: RBC: 5.5 — AB (ref 3.87–5.11)

## 2020-12-08 LAB — MICROALBUMIN, URINE: Microalb, Ur: <12

## 2021-01-02 ENCOUNTER — Other Ambulatory Visit (HOSPITAL_COMMUNITY): Payer: Self-pay

## 2021-01-02 ENCOUNTER — Encounter (INDEPENDENT_AMBULATORY_CARE_PROVIDER_SITE_OTHER): Payer: BC Managed Care – PPO | Admitting: Physician Assistant

## 2021-01-02 ENCOUNTER — Ambulatory Visit: Payer: BC Managed Care – PPO | Admitting: Infectious Disease

## 2021-01-02 ENCOUNTER — Ambulatory Visit (INDEPENDENT_AMBULATORY_CARE_PROVIDER_SITE_OTHER): Payer: BC Managed Care – PPO | Admitting: Infectious Disease

## 2021-01-02 ENCOUNTER — Other Ambulatory Visit (HOSPITAL_COMMUNITY)
Admission: RE | Admit: 2021-01-02 | Discharge: 2021-01-02 | Disposition: A | Discharge status: Home / Self Care | Payer: BC Managed Care – PPO | Source: Ambulatory Visit | Attending: Infectious Disease | Admitting: Infectious Disease

## 2021-01-02 ENCOUNTER — Other Ambulatory Visit: Payer: Self-pay

## 2021-01-02 ENCOUNTER — Other Ambulatory Visit: Payer: Self-pay | Admitting: *Deleted

## 2021-01-02 ENCOUNTER — Encounter: Payer: Self-pay | Admitting: Infectious Disease

## 2021-01-02 VITALS — BP 126/64 | HR 70 | Temp 98.0°F | Wt 154.2 lb

## 2021-01-02 DIAGNOSIS — B20 Human immunodeficiency virus [HIV] disease: Secondary | ICD-10-CM | POA: Insufficient documentation

## 2021-01-02 DIAGNOSIS — Z006 Encounter for examination for normal comparison and control in clinical research program: Secondary | ICD-10-CM

## 2021-01-02 DIAGNOSIS — B354 Tinea corporis: Secondary | ICD-10-CM

## 2021-01-02 DIAGNOSIS — A5486 Gonococcal sepsis: Secondary | ICD-10-CM | POA: Diagnosis not present

## 2021-01-02 DIAGNOSIS — Z8619 Personal history of other infectious and parasitic diseases: Secondary | ICD-10-CM

## 2021-01-02 DIAGNOSIS — R590 Localized enlarged lymph nodes: Secondary | ICD-10-CM

## 2021-01-02 HISTORY — DX: Tinea corporis: B35.4

## 2021-01-02 MED ORDER — CABENUVA 600 & 900 MG/3ML IM SUER: 1.0000 | INTRAMUSCULAR | 6 refills | Status: DC

## 2021-01-02 MED ORDER — CLOTRIMAZOLE 1 % EX CREA: 1.0000 "application " | TOPICAL_CREAM | Freq: Two times a day (BID) | CUTANEOUS | 2 refills | Status: DC

## 2021-01-02 MED ORDER — CABENUVA 600 & 900 MG/3ML IM SUER
1.0000 | INTRAMUSCULAR | 6 refills | Status: DC
  Filled 2021-01-02 – 2021-03-14 (×2): qty 6, 60d supply, fill #0
  Filled 2021-03-15: qty 6, 30d supply, fill #0
  Filled 2021-05-15: qty 6, 30d supply, fill #1
  Filled 2021-07-11: qty 6, 30d supply, fill #2

## 2021-01-02 NOTE — Progress Notes (Signed)
Subjective:   Chief complaint: Follow-up for HIV disease just received his first dose of [place]   Patient ID: David Hoffman, male    DOB: [birth year], 30 y.o.   MRN: [id number]  HPI  Fuller Mandril is a 30 year old African-American man living with HIV who is enrolled in [place] study receiving Biktarvy from this study until today when he first got loading dose of Cabenuva from [place] team and to get another dose from study team on June 30.   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  Testing for STIs again.  He saw Silvio Pate, PA with complaint of a rash that look like tinea corporis but he had not started his clotrimazole he did not pick it up until today.    Past Medical History:  Diagnosis Date  . Abdominal pain 07/08/2019  . Cervical lymphadenopathy 10/26/2015  . Exudative pharyngitis 01/19/2015  . Gastroenteritis presumed infectious 10/26/2015  . Gonorrhea 01/19/2015  . History of gonorrhea   . HIV disease [facility]) march 2016 dx  . Oral lesion 10/26/2015  . 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 Routine screening for STI (sexually transmitted infection) 07/18/2020  . Tinea pedis     No past surgical history on file.  Family History  Problem Relation Age of Onset  . Diabetes Mother   . Schizophrenia Maternal Grandmother       Social History   Socioeconomic History  . Marital status: Single    Spouse name: Not on file  . Number of children: Not on file  . Years of education: Not on file  . Highest education level: Not on file  Occupational History  . Not on file  Tobacco Use  . Smoking status: Current Every Day Smoker    Types: Cigars    Last attempt to quit: 11/09/2014    Years since quitting: 6.1  . Smokeless tobacco: Never Used  Substance and Sexual Activity  . Alcohol use: Yes    Alcohol/week: 0.0 standard drinks    Comment: rare use  . Drug use: Yes    Types: Marijuana    Comment: daily  . Sexual activity:  Yes    Partners: Male    Comment: offered condoms 06/2019  Other Topics Concern  . Not on file  Social History Narrative  . Not on file   Social Determinants of Health   Financial Resource Strain: Not on file  Food Insecurity: Not on file  Transportation Needs: Not on file  Physical Activity: Not on file  Stress: Not on file  Social Connections: Not on file    No Known Allergies   Current Outpatient Medications:  .  clotrimazole (LOTRIMIN) 1 % cream, Apply 1 application topically 2 (two) times daily., Disp: 30 g, Rfl: 2 .  [START ON 01/03/2021] cabotegravir & rilpivirine ER (CABENUVA) 600 & 900 MG/3ML injection, Inject 1 kit into the muscle every 2 (two) months., Disp: 3 mL, Rfl: 6   Review of Systems  Constitutional: Negative for chills and fever.  HENT: Negative for congestion, sinus pressure and sore throat.   Eyes: Negative for photophobia.  Respiratory: Negative for cough, shortness of breath and wheezing.   Cardiovascular: Negative for chest pain, palpitations and leg swelling.  Gastrointestinal: Negative for blood in stool, constipation, diarrhea, nausea and vomiting.  Genitourinary: Negative for dysuria, flank pain and hematuria.  Musculoskeletal: Negative for back pain and myalgias.  Skin: Positive for rash.  Neurological: Negative for dizziness, weakness and headaches.  Hematological: Does not bruise/bleed easily.  Psychiatric/Behavioral: Negative for decreased concentration, dysphoric mood, hallucinations and suicidal ideas. The patient is not nervous/anxious and is not hyperactive.        Objective:   Physical Exam Constitutional:      General: He is not in acute distress.    Appearance: Normal appearance. He is well-developed. He is not ill-appearing or diaphoretic.  HENT:     Head: Normocephalic and atraumatic.     Right Ear: Hearing and external ear normal.     Left Ear: Hearing and external ear normal.     Nose: No nasal deformity or rhinorrhea.   Eyes:     General: No scleral icterus.    Extraocular Movements: Extraocular movements intact.     Conjunctiva/sclera: Conjunctivae normal.     Right eye: Right conjunctiva is not injected.     Left eye: Left conjunctiva is not injected.  Neck:     Vascular: No JVD.  Cardiovascular:     Rate and Rhythm: Normal rate and regular rhythm.     Heart sounds: S1 normal and S2 normal.  Pulmonary:     Effort: Pulmonary effort is normal. No respiratory distress.  Abdominal:     General: Abdomen is flat. There is no distension.     Palpations: Abdomen is soft. There is no mass.     Tenderness: There is no abdominal tenderness.  Genitourinary:    Penis: No paraphimosis.      Testes: Normal.  Musculoskeletal:        General: Normal range of motion.     Right shoulder: Normal.     Left shoulder: Normal.     Cervical back: Normal range of motion and neck supple.     Right hip: Normal.     Left hip: Normal.     Right knee: Normal.     Left knee: Normal.  Lymphadenopathy:     Head:     Right side of head: No submandibular, preauricular or posterior auricular adenopathy.     Left side of head: No submandibular, preauricular or posterior auricular adenopathy.     Cervical: No cervical adenopathy.     Right cervical: No superficial or deep cervical adenopathy.    Left cervical: No superficial or deep cervical adenopathy.  Skin:    General: Skin is warm and dry.     Coloration: Skin is not pale.     Findings: No abrasion, bruising, ecchymosis, erythema, lesion or rash.     Nails: There is no clubbing.  Neurological:     General: No focal deficit present.     Mental Status: He is alert and oriented to person, place, and time. Mental status is at baseline.     Sensory: No sensory deficit.     Coordination: Coordination normal.     Gait: Gait normal.  Psychiatric:        Attention and Perception: He is attentive.        Mood and Affect: Mood normal.        Speech: Speech normal.         Behavior: Behavior normal. Behavior is cooperative.        Thought Content: Thought content normal.        Judgment: Judgment normal.           Assessment & Plan:   HIV: Received loading dose Cabenuva and gets next loading dose  We will then give him commercial product in August I have scheduled him  in August 26, then October 26 November 26 and December 27.  I could not schedule into 2023 because there were no templates available  Would get HIV RNAs with each injection    History of disseminated gonococcal infection check GC chlamydia oropharynx rectum and urine  Rash: try triamcinolone. I also ordered an RPR  I spent more than 40  minutes with the patient including greater than 50% of time in face to face counseling of the patient personally reviewing radiographs, long with pertinent laboratory microbiological data review of medical records and in coordination of his care.

## 2021-01-02 NOTE — Research (Signed)
David Hoffman received his first injections of cabanuva today after he took his last dose of Biktarvy for study. He was instructed to take tylenol for pain or use ice on the injection sites if they cause significant pain. He was instructed to call for any changes in disposition otherwise. He will followup with another loading dose in 4 weeks and then transition to the clinic dosing every 2 months. He denies any new problems or medications. He is moving to [place] in the next month or two for a new job change.

## 2021-01-03 LAB — CYTOLOGY, (ORAL, ANAL, URETHRAL) ANCILLARY ONLY
Chlamydia: NEGATIVE
Chlamydia: NEGATIVE
Comment: NEGATIVE
Comment: NEGATIVE
Comment: NORMAL
Comment: NORMAL
Neisseria Gonorrhea: NEGATIVE
Neisseria Gonorrhea: NEGATIVE

## 2021-01-03 LAB — URINE CYTOLOGY ANCILLARY ONLY
Chlamydia: NEGATIVE
Comment: NEGATIVE
Comment: NORMAL
Neisseria Gonorrhea: NEGATIVE

## 2021-01-26 ENCOUNTER — Other Ambulatory Visit: Payer: Self-pay

## 2021-01-26 ENCOUNTER — Encounter (INDEPENDENT_AMBULATORY_CARE_PROVIDER_SITE_OTHER): Payer: BC Managed Care – PPO | Admitting: *Deleted

## 2021-01-26 DIAGNOSIS — Z006 Encounter for examination for normal comparison and control in clinical research program: Secondary | ICD-10-CM

## 2021-01-26 NOTE — Research (Signed)
David Hoffman was here for his month 14 visit for the SOLAR study. He will be transitioning to clinic provided cabaneuva after this. He just moved to [place] and has a new job there but is willing to come back here to get transitioned to the clinic providing the cabaneuva.  He denies any new problems and just 1 day of soreness with the injections last time.

## 2021-02-16 LAB — CD4/CD8 (T-HELPER/T-SUPPRESSOR CELL)
CD4 % Helper T Cell: 37
CD4 Count: 1120
HIV 1 RNA UltraQuant: <50

## 2021-03-14 ENCOUNTER — Other Ambulatory Visit (HOSPITAL_COMMUNITY): Payer: Self-pay

## 2021-03-14 ENCOUNTER — Telehealth: Payer: Self-pay

## 2021-03-14 NOTE — Telephone Encounter (Signed)
[facility] Patient Advocate Encounter   Received notification from [employer] that prior authorization for David Hoffman is required.   PA submitted on 03/14/21 Key [id number] Status is pending    [facility] Clinic will continue to follow.   Clearance Coots, CPhT Specialty Pharmacy Patient [facility] Phone: [phone] Fax:  [phone]

## 2021-03-15 ENCOUNTER — Other Ambulatory Visit (HOSPITAL_COMMUNITY): Payer: Self-pay

## 2021-03-15 ENCOUNTER — Telehealth: Payer: Self-pay

## 2021-03-15 NOTE — Telephone Encounter (Signed)
[facility] Patient Advocate Encounter  Prior Authorization for Renaldo Harrison has been approved.    PA# [employer] [id number] YP Effective dates: 03/14/21 through 03/14/22  Patients co-pay is $3400.00.   [facility] will continue to follow.  Clearance Coots, CPhT Specialty Pharmacy Patient [facility] Phone: [phone] Fax:  [phone]

## 2021-03-16 ENCOUNTER — Telehealth: Payer: Self-pay

## 2021-03-16 NOTE — Telephone Encounter (Signed)
[facility] Patient Advocate Encounter  Patient's medication (Cabenuva) have been couriered to [facility] from [facility] and will be administered on patient next office visit on 03/24/21.  [name] , CPhT Specialty Pharmacy Patient [facility] Phone: [phone] Fax:  [phone]  

## 2021-03-24 ENCOUNTER — Encounter: Payer: BC Managed Care – PPO | Admitting: Pharmacist

## 2021-03-24 ENCOUNTER — Telehealth: Payer: Self-pay | Admitting: Pharmacist

## 2021-03-24 NOTE — Telephone Encounter (Signed)
Called patient to see if he was coming to get his Cabenuva injection. No answer, left HIPAA compliant VM.

## 2021-03-27 ENCOUNTER — Other Ambulatory Visit (HOSPITAL_COMMUNITY)
Admission: RE | Admit: 2021-03-27 | Discharge: 2021-03-27 | Disposition: A | Discharge status: Home / Self Care | Payer: 59 | Source: Ambulatory Visit | Attending: Infectious Disease | Admitting: Infectious Disease

## 2021-03-27 ENCOUNTER — Other Ambulatory Visit: Payer: Self-pay

## 2021-03-27 ENCOUNTER — Other Ambulatory Visit (HOSPITAL_COMMUNITY): Payer: Self-pay

## 2021-03-27 ENCOUNTER — Ambulatory Visit (INDEPENDENT_AMBULATORY_CARE_PROVIDER_SITE_OTHER): Payer: Self-pay | Admitting: Pharmacist

## 2021-03-27 DIAGNOSIS — B20 Human immunodeficiency virus [HIV] disease: Secondary | ICD-10-CM

## 2021-03-27 DIAGNOSIS — Z113 Encounter for screening for infections with a predominantly sexual mode of transmission: Secondary | ICD-10-CM

## 2021-03-27 DIAGNOSIS — Z202 Contact with and (suspected) exposure to infections with a predominantly sexual mode of transmission: Secondary | ICD-10-CM

## 2021-03-27 MED ORDER — CABOTEGRAVIR & RILPIVIRINE ER 600 & 900 MG/3ML IM SUER
1.0000 | Freq: Once | INTRAMUSCULAR | Status: AC
  Administered 2021-03-27: 1 via INTRAMUSCULAR

## 2021-03-27 MED ORDER — CEFTRIAXONE SODIUM 500 MG IJ SOLR
500.0000 mg | Freq: Once | INTRAMUSCULAR | Status: AC
  Administered 2021-03-27: 500 mg via INTRAMUSCULAR

## 2021-03-27 NOTE — Addendum Note (Signed)
Addended by: Harley Alto on: 03/27/2021 04:17 PM   Modules accepted: Orders

## 2021-03-27 NOTE — Progress Notes (Signed)
HPI: David Hoffman is a 30 y.o. male who presents to the [place] clinic for [place] administration.  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   CLOTRIMAZOLE (LOTRIMIN) 1 % CREAM    Apply 1 application topically 2 (two) times daily.  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   Routine screening for STI (sexually transmitted infection) 07/18/2020   Tinea corporis 01/02/2021   Tinea pedis     Social History: Social History   Socioeconomic History   Marital status: Single    Spouse name: Not on file   Number of children: Not on file   Years of education: Not on  file   Highest education level: Not on file  Occupational History   Not on file  Tobacco Use   Smoking status: Every Day    Types: Cigars    Last attempt to quit: 11/09/2014    Years since quitting: 6.3   Smokeless tobacco: Never  Substance and Sexual Activity   Alcohol use: Yes    Alcohol/week: 0.0 standard drinks    Comment: rare use   Drug use: Yes    Types: Marijuana    Comment: daily   Sexual activity: Yes    Partners: Male    Comment: offered condoms 06/2019  Other Topics Concern   Not on file  Social History Narrative   Not on file   Social Determinants of Health   Financial Resource Strain: Not on file  Food Insecurity: Not on file  Transportation Needs: Not on file  Physical Activity: Not on file  Stress: Not on file  Social Connections: Not on file    Labs: Lab Results  Component Value Date   HIV1RNAQUANT <50 01/01/2020   HIV1RNAQUANT 74 (H) 12/30/2019   HIV1RNAQUANT <20 09/04/2018   HIV1RNAVL <20 05/13/2019   HIV1RNAVL <20 02/16/2019   HIV1RNAVL 32 04/07/2018   CD4TABS 1,355 10/10/2020   CD4TABS 1,097 12/30/2019   CD4TABS 960 09/04/2018    RPR and STI Lab Results  Component Value Date   LABRPR REACTIVE (A) 07/18/2020   LABRPR REACTIVE (A) 12/30/2019   LABRPR REACTIVE (A) 07/08/2019   LABRPR REACTIVE (A) 10/26/2015   LABRPR Non  Reactive 01/20/2015   RPRTITER 1:2 (H) 07/18/2020   RPRTITER 1:2 (H) 12/30/2019   RPRTITER 1:1 (H) 07/08/2019   RPRTITER 1:32 (A) 10/26/2015    Hepatitis B Lab Results  Component Value Date   HEPBSAB Positive 10/18/2017   HEPBSAG NEGATIVE 10/28/2014   HEPBCAB Negative 10/18/2017   Hepatitis C Lab Results  Component Value Date   HEPCAB Negative 10/18/2017   Hepatitis A Lab Results  Component Value Date   HAV NON REACTIVE 10/28/2014   Lipids: Lab Results  Component Value Date   CHOL 144 12/08/2020   TRIG 75 12/08/2020   HDL 52 12/08/2020   CHOLHDL 3.6 10/28/2014   VLDL 20 10/28/2014   LDLCALC 77  12/08/2020    TARGET DATE: The 29th of the month  Assessment: David Hoffman presents today for their maintenance Cabenuva injections. Initial injections were tolerated well without issues. No problems with systemic side effects of injections. Patient did experience some mild pain.   Administered cabotegravir 627m/3mL in left upper outer quadrant of the gluteal muscle. Administered rilpivirine 900 mg/368min the right upper outer quadrant of the gluteal muscle. Monitored patient for 10 minutes after injection. Injections were tolerated well without issue. Patient will follow up in 2 months for next injection.  Patient did have recent exposure to gonorrhea as told by his partner. He has insertive, receptive, and oral sex with this partner. He is experiencing some throat soreness. Will treat with ceftriaxone today and test all three sites as well. Will call him if anything else results positive.  Plan: - Cabenuva injections administered - Ceftriaxone 500 mg IM x 1 administered for gonorrhea exposure - HIV viral load, RPR, GC/CT cytology - Next Cabenuva injections scheduled for 10/24 - Call with any issues or questions  Kristianne Albin L. Tamar Lipscomb, PharmD, BCIDP, AAHIVP, CPSouth [place]inical Pharmacist Practitioner [place]

## 2021-03-28 LAB — CYTOLOGY, (ORAL, ANAL, URETHRAL) ANCILLARY ONLY
Chlamydia: NEGATIVE
Chlamydia: NEGATIVE
Comment: NEGATIVE
Comment: NEGATIVE
Comment: NORMAL
Comment: NORMAL
Neisseria Gonorrhea: NEGATIVE
Neisseria Gonorrhea: POSITIVE — AB

## 2021-03-29 LAB — RPR: RPR Ser Ql: NONREACTIVE

## 2021-03-29 LAB — HIV-1 RNA QUANT-NO REFLEX-BLD
HIV 1 RNA Quant: <20 Copies/mL — ABNORMAL HIGH
HIV-1 RNA Quant, Log: <1.3 Log cps/mL — ABNORMAL HIGH

## 2021-05-08 ENCOUNTER — Other Ambulatory Visit (HOSPITAL_COMMUNITY): Payer: Self-pay

## 2021-05-15 ENCOUNTER — Other Ambulatory Visit (HOSPITAL_COMMUNITY): Payer: Self-pay

## 2021-05-16 ENCOUNTER — Telehealth: Payer: Self-pay

## 2021-05-16 NOTE — Telephone Encounter (Signed)
[facility] Patient Advocate Encounter  Patient's medication (Cabenuva)have been couriered to [facility] from [facility] and will be administered on patient next office visit on 05/22/21.  Chanelle Hodsdon , CPhT Specialty Pharmacy Patient [facility] Phone: [phone] Fax:  [phone]  

## 2021-05-22 ENCOUNTER — Encounter: Payer: BC Managed Care – PPO | Admitting: Pharmacist

## 2021-05-24 ENCOUNTER — Ambulatory Visit: Payer: BC Managed Care – PPO | Admitting: Pharmacist

## 2021-05-25 ENCOUNTER — Ambulatory Visit (INDEPENDENT_AMBULATORY_CARE_PROVIDER_SITE_OTHER): Payer: Self-pay | Admitting: Pharmacist

## 2021-05-25 ENCOUNTER — Other Ambulatory Visit: Payer: Self-pay

## 2021-05-25 ENCOUNTER — Other Ambulatory Visit (HOSPITAL_COMMUNITY)
Admission: RE | Admit: 2021-05-25 | Discharge: 2021-05-25 | Disposition: A | Discharge status: Home / Self Care | Payer: Self-pay | Source: Ambulatory Visit | Attending: Infectious Disease | Admitting: Infectious Disease

## 2021-05-25 DIAGNOSIS — Z113 Encounter for screening for infections with a predominantly sexual mode of transmission: Secondary | ICD-10-CM | POA: Insufficient documentation

## 2021-05-25 DIAGNOSIS — B354 Tinea corporis: Secondary | ICD-10-CM

## 2021-05-25 DIAGNOSIS — B20 Human immunodeficiency virus [HIV] disease: Secondary | ICD-10-CM

## 2021-05-25 MED ORDER — CABOTEGRAVIR & RILPIVIRINE ER 600 & 900 MG/3ML IM SUER
1.0000 | Freq: Once | INTRAMUSCULAR | Status: AC
Start: 2021-05-25 — End: 2021-05-25
  Administered 2021-05-25: 1 via INTRAMUSCULAR

## 2021-05-25 MED ORDER — CLOTRIMAZOLE 1 % EX CREA: 1.0000 "application " | TOPICAL_CREAM | Freq: Two times a day (BID) | CUTANEOUS | 5 refills | Status: AC

## 2021-05-25 NOTE — Progress Notes (Signed)
HPI: David Hoffman is a 30 y.o. male who presents to the [place] clinic for [place] administration.  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   CLOTRIMAZOLE (LOTRIMIN) 1 % CREAM    Apply 1 application topically 2 (two) times daily.  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   Routine screening for STI (sexually transmitted infection) 07/18/2020   Tinea corporis 01/02/2021   Tinea pedis     Social History: Social History   Socioeconomic History   Marital status: Single    Spouse name: Not on file   Number of children: Not on file   Years of education: Not on  file   Highest education level: Not on file  Occupational History   Not on file  Tobacco Use   Smoking status: Every Day    Types: Cigars    Last attempt to quit: 11/09/2014    Years since quitting: 6.5   Smokeless tobacco: Never  Substance and Sexual Activity   Alcohol use: Yes    Alcohol/week: 0.0 standard drinks    Comment: rare use   Drug use: Yes    Types: Marijuana    Comment: daily   Sexual activity: Yes    Partners: Male    Comment: offered condoms 06/2019  Other Topics Concern   Not on file  Social History Narrative   Not on file   Social Determinants of Health   Financial Resource Strain: Not on file  Food Insecurity: Not on file  Transportation Needs: Not on file  Physical Activity: Not on file  Stress: Not on file  Social Connections: Not on file    Labs: Lab Results  Component Value Date   HIV1RNAQUANT <20 (H) 03/27/2021   HIV1RNAQUANT <50 01/01/2020   HIV1RNAQUANT 74 (H) 12/30/2019   HIV1RNAVL <20 05/13/2019   HIV1RNAVL <20 02/16/2019   HIV1RNAVL 32 04/07/2018   CD4TABS 1,355 10/10/2020   CD4TABS 1,097 12/30/2019   CD4TABS 960 09/04/2018    RPR and STI Lab Results  Component Value Date   LABRPR NON-REACTIVE 03/27/2021   LABRPR REACTIVE (A) 07/18/2020   LABRPR REACTIVE (A) 12/30/2019   LABRPR REACTIVE (A) 07/08/2019   LABRPR REACTIVE (  A) 10/26/2015   RPRTITER 1:2 (H) 07/18/2020   RPRTITER 1:2 (H) 12/30/2019   RPRTITER 1:1 (H) 07/08/2019   RPRTITER 1:32 (A) 10/26/2015    Hepatitis B Lab Results  Component Value Date   HEPBSAB Positive 10/18/2017   HEPBSAG NEGATIVE 10/28/2014   HEPBCAB Negative 10/18/2017   Hepatitis C Lab Results  Component Value Date   HEPCAB Negative 10/18/2017   Hepatitis A Lab Results  Component Value Date   HAV NON REACTIVE 10/28/2014   Lipids: Lab Results  Component Value Date   CHOL 144 12/08/2020   TRIG 75 12/08/2020   HDL 52 12/08/2020   CHOLHDL 3.6 10/28/2014   VLDL 20 10/28/2014   LDLCALC 77  12/08/2020    TARGET DATE: The 29th of the month  Assessment: David Hoffman presents today for their maintenance Cabenuva injections. Initial/past injections were tolerated well without issues. No problems with systemic side effects of injections.   Administered cabotegravir 661m/3mL in left upper outer quadrant of the gluteal muscle. Administered rilpivirine 900 mg/355min the right upper outer quadrant of the gluteal muscle. Monitored patient for 10 minutes after injection. Injections were tolerated well without issue. Patient will follow up in 2 months for next injection. Denied the flu shot today.  Plan: - Cabenuva injections administered - HIV RNA, CD4, RPR, urine cytology today - Next injections scheduled for 07/25/21 and 09/25/21 - Call with any issues or questions  David Hoffman, PharmD, BCIDP, AAHIVP, CPCreightonlinical Pharmacist Practitioner [place] [facility]

## 2021-05-26 LAB — URINE CYTOLOGY ANCILLARY ONLY
Chlamydia: NEGATIVE
Comment: NEGATIVE
Comment: NORMAL
Neisseria Gonorrhea: NEGATIVE

## 2021-05-26 LAB — T-HELPER CELL (CD4) - (RCID CLINIC ONLY)
CD4 % Helper T Cell: 41 % (ref 33–65)
CD4 T Cell Abs: 1265 /uL (ref 400–1790)

## 2021-05-29 LAB — HIV-1 RNA QUANT-NO REFLEX-BLD
HIV 1 RNA Quant: <20 Copies/mL — ABNORMAL HIGH
HIV-1 RNA Quant, Log: <1.3 Log cps/mL — ABNORMAL HIGH

## 2021-05-29 LAB — RPR TITER: RPR Titer: 1:1 {titer} — ABNORMAL HIGH

## 2021-05-29 LAB — FLUORESCENT TREPONEMAL AB(FTA)-IGG-BLD: Fluorescent Treponemal ABS: REACTIVE — AB

## 2021-05-29 LAB — RPR: RPR Ser Ql: REACTIVE — AB

## 2021-07-11 ENCOUNTER — Other Ambulatory Visit (HOSPITAL_COMMUNITY): Payer: Self-pay

## 2021-07-12 ENCOUNTER — Other Ambulatory Visit (HOSPITAL_COMMUNITY): Payer: Self-pay

## 2021-07-13 ENCOUNTER — Telehealth: Payer: Self-pay

## 2021-07-13 NOTE — Telephone Encounter (Signed)
[facility] Patient Advocate Encounter  Patient's medication Renaldo Harrison) have been couriered to [facility] from [employer] [employer] [facility] and will be administered on the patient next office visit on 07/25/21.  Clearance Coots , CPhT Specialty Pharmacy Patient [facility] for Infect[facility] Phone: [phone] Fax:  [phone]

## 2021-07-25 ENCOUNTER — Encounter: Payer: Self-pay | Admitting: Pharmacist

## 2021-07-27 ENCOUNTER — Ambulatory Visit (INDEPENDENT_AMBULATORY_CARE_PROVIDER_SITE_OTHER): Payer: Self-pay | Admitting: Pharmacist

## 2021-07-27 ENCOUNTER — Other Ambulatory Visit: Payer: Self-pay

## 2021-07-27 ENCOUNTER — Other Ambulatory Visit (HOSPITAL_COMMUNITY)
Admission: RE | Admit: 2021-07-27 | Discharge: 2021-07-27 | Disposition: A | Discharge status: Home / Self Care | Payer: Self-pay | Source: Ambulatory Visit | Attending: Infectious Disease | Admitting: Infectious Disease

## 2021-07-27 DIAGNOSIS — Z113 Encounter for screening for infections with a predominantly sexual mode of transmission: Secondary | ICD-10-CM | POA: Insufficient documentation

## 2021-07-27 DIAGNOSIS — B20 Human immunodeficiency virus [HIV] disease: Secondary | ICD-10-CM

## 2021-07-27 MED ORDER — CABOTEGRAVIR & RILPIVIRINE ER 600 & 900 MG/3ML IM SUER
1.0000 | Freq: Once | INTRAMUSCULAR | Status: AC
  Administered 2021-07-27: 15:00:00 1 via INTRAMUSCULAR

## 2021-07-27 NOTE — Progress Notes (Signed)
HPI: David Hoffman is a 30 y.o. male who presents to the [place] clinic for [place] administration.  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   CLOTRIMAZOLE (LOTRIMIN) 1 % CREAM    Apply 1 application topically 2 (two) times daily.  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   Routine screening for STI (sexually transmitted infection) 07/18/2020   Tinea corporis 01/02/2021   Tinea pedis     Social History: Social History   Socioeconomic History   Marital status: Single    Spouse name: Not on file   Number of children: Not on file   Years of education: Not on  file   Highest education level: Not on file  Occupational History   Not on file  Tobacco Use   Smoking status: Every Day    Types: Cigars    Last attempt to quit: 11/09/2014    Years since quitting: 6.7   Smokeless tobacco: Never  Substance and Sexual Activity   Alcohol use: Yes    Alcohol/week: 0.0 standard drinks    Comment: rare use   Drug use: Yes    Types: Marijuana    Comment: daily   Sexual activity: Yes    Partners: Male    Comment: offered condoms 06/2019  Other Topics Concern   Not on file  Social History Narrative   Not on file   Social Determinants of Health   Financial Resource Strain: Not on file  Food Insecurity: Not on file  Transportation Needs: Not on file  Physical Activity: Not on file  Stress: Not on file  Social Connections: Not on file    Labs: Lab Results  Component Value Date   HIV1RNAQUANT <20 (H) 05/25/2021   HIV1RNAQUANT <20 (H) 03/27/2021   HIV1RNAQUANT <50 01/01/2020   HIV1RNAVL <20 05/13/2019   HIV1RNAVL <20 02/16/2019   HIV1RNAVL 32 04/07/2018   CD4TABS 1,265 05/25/2021   CD4TABS 1,355 10/10/2020   CD4TABS 1,097 12/30/2019    RPR and STI Lab Results  Component Value Date   LABRPR REACTIVE (A) 05/25/2021   LABRPR NON-REACTIVE 03/27/2021   LABRPR REACTIVE (A) 07/18/2020   LABRPR REACTIVE (A) 12/30/2019   LABRPR REACTIVE (  A) 07/08/2019   RPRTITER 1:1 (H) 05/25/2021   RPRTITER 1:2 (H) 07/18/2020   RPRTITER 1:2 (H) 12/30/2019   RPRTITER 1:1 (H) 07/08/2019   RPRTITER 1:32 (A) 10/26/2015    Hepatitis B Lab Results  Component Value Date   HEPBSAB Positive 10/18/2017   HEPBSAG NEGATIVE 10/28/2014   HEPBCAB Negative 10/18/2017   Hepatitis C Lab Results  Component Value Date   HEPCAB Negative 10/18/2017   Hepatitis A Lab Results  Component Value Date   HAV NON REACTIVE 10/28/2014   Lipids: Lab Results  Component Value Date   CHOL 144 12/08/2020   TRIG 75 12/08/2020   HDL 52 12/08/2020   CHOLHDL 3.6 10/28/2014    VLDL 20 10/28/2014   LDLCALC 77 12/08/2020    TARGET DATE: The 29th of the month  Assessment: David Hoffman presents today for their maintenance Cabenuva injections. Initial/past injections were tolerated well without issues. No problems with systemic side effects of injections.   Administered cabotegravir 672m/3mL in left upper outer quadrant of the gluteal muscle. Administered rilpivirine 900 mg/31min the right upper outer quadrant of the gluteal muscle. Monitored patient for 10 minutes after injection. Injections were tolerated well without issue. Patient will follow up in 2 months for next set of injections.  No new partners but requested STI testing today.   Plan: - Cabenuva injections administered - HIV RNA, RPR, urine/rectal/pharyngeal GC/CT swabs for cytology today - Next injections scheduled for 09/25/21 with me - Call with any issues or questions  Josealberto Montalto L. Jamileth Putzier, PharmD, BCIDP, AAHIVP, CPFranklin Grovelinical Pharmacist Practitioner [place] [facility]

## 2021-07-28 ENCOUNTER — Telehealth: Payer: Self-pay | Admitting: Pharmacist

## 2021-07-28 LAB — CYTOLOGY, (ORAL, ANAL, URETHRAL) ANCILLARY ONLY
Chlamydia: NEGATIVE
Chlamydia: POSITIVE — AB
Comment: NEGATIVE
Comment: NEGATIVE
Comment: NORMAL
Comment: NORMAL
Neisseria Gonorrhea: POSITIVE — AB
Neisseria Gonorrhea: POSITIVE — AB

## 2021-07-28 LAB — URINE CYTOLOGY ANCILLARY ONLY
Chlamydia: NEGATIVE
Comment: NEGATIVE
Comment: NORMAL
Neisseria Gonorrhea: NEGATIVE

## 2021-07-28 NOTE — Telephone Encounter (Signed)
Called patient to discuss results and schedule treatment. No answer, left HIPAA compliant VM. He was going out of town for the weekend. Will try and reach him again next week.  Makilah Dowda L. Cutter Passey, PharmD [facility] Clinical Pharmacist Practitioner

## 2021-08-01 ENCOUNTER — Other Ambulatory Visit: Payer: Self-pay | Admitting: Pharmacist

## 2021-08-01 DIAGNOSIS — B20 Human immunodeficiency virus [HIV] disease: Secondary | ICD-10-CM

## 2021-08-01 LAB — FLUORESCENT TREPONEMAL AB(FTA)-IGG-BLD: Fluorescent Treponemal ABS: REACTIVE — AB

## 2021-08-01 LAB — HIV-1 RNA QUANT-NO REFLEX-BLD
HIV 1 RNA Quant: NOT DETECTED Copies/mL
HIV-1 RNA Quant, Log: NOT DETECTED Log cps/mL

## 2021-08-01 LAB — RPR: RPR Ser Ql: REACTIVE — AB

## 2021-08-01 LAB — RPR TITER: RPR Titer: 1:2 {titer} — ABNORMAL HIGH

## 2021-08-01 MED ORDER — DOXYCYCLINE HYCLATE 100 MG PO TABS: 100.0000 mg | ORAL_TABLET | Freq: Two times a day (BID) | ORAL | 0 refills | Status: AC

## 2021-08-01 NOTE — Telephone Encounter (Signed)
Talked with patient and relayed results. Sent in doxycycline x 7 days to [facility]. Advised to take twice a day with a meal. Patient will come in on Thursday at 845am for gonorrhea treatment with ceftriaxone injection. No antibiotic allergies. Asked to abstain from sexual activity for 7 days after treatment.   David Hoffman L. Tinley Rought, PharmD [facility] Clinical Pharmacist Practitioner

## 2021-08-03 ENCOUNTER — Ambulatory Visit: Payer: Self-pay | Admitting: Pharmacist

## 2021-08-04 ENCOUNTER — Ambulatory Visit: Payer: Self-pay | Admitting: Pharmacist

## 2021-08-07 ENCOUNTER — Telehealth: Payer: Self-pay

## 2021-08-07 NOTE — Telephone Encounter (Signed)
Patient no-showed for gonorrhea treatment with pharmacy on 1/6. Called to reschedule, no answer and voicemail is full.   Sandie Ano, RN

## 2021-08-09 ENCOUNTER — Telehealth: Payer: Self-pay | Admitting: Pharmacist

## 2021-08-09 NOTE — Telephone Encounter (Signed)
Patient missed last 2 appointments with [place] for gonorrhea treatment. Called to discuss. He was unable to get off from work but has an appointment with urgent care today at 330pm to get treatment. Will e-mail results to him. Asked him to let me know when he is treated.   Carlotta Telfair L. Ameliarose Shark, PharmD [facility] Clinical Pharmacist Practitioner

## 2021-08-10 NOTE — Telephone Encounter (Signed)
Patient called and wasn't able to get treatment at urgent care because of a $85 copay. He asked to come tomorrow so I made an appointment with Marchelle Folks for 315pm for gonorrhea treatment.

## 2021-08-11 ENCOUNTER — Other Ambulatory Visit: Payer: Self-pay

## 2021-08-11 ENCOUNTER — Ambulatory Visit (INDEPENDENT_AMBULATORY_CARE_PROVIDER_SITE_OTHER): Payer: BC Managed Care – PPO | Admitting: Pharmacist

## 2021-08-11 DIAGNOSIS — A549 Gonococcal infection, unspecified: Secondary | ICD-10-CM | POA: Diagnosis not present

## 2021-08-11 MED ORDER — CEFTRIAXONE SODIUM 500 MG IJ SOLR
500.0000 mg | Freq: Once | INTRAMUSCULAR | Status: AC
  Administered 2021-08-11: 500 mg via INTRAMUSCULAR

## 2021-08-11 NOTE — Progress Notes (Signed)
° °  [facility] Vaccination Visit  HPI: David Hoffman is a 31 y.o. male who presents to the [facility] pharmacy clinic for vaccine administration.  Hepatitis B Lab Results  Component Value Date   HEPBSAB Positive 10/18/2017   Lab Results  Component Value Date   HEPBSAG NEGATIVE 10/28/2014    Hepatitis C Lab Results  Component Value Date   HCVAB NEGATIVE 10/28/2014    Hepatitis A Lab Results  Component Value Date   HAV NON REACTIVE 10/28/2014    Assessment & Plan: Mr. David Hoffman presents to clinic today for gonorrhea treatment. Ceftriaxone was administered. He has also been taking doxycycline outpatient for chlamydia. He is doing well with this and is almost finished with his 7-day course.   - Administered ceftriaxone 500 mg IM - Patient tolerated well - Patient has follow-up appointments scheduled for Cabenuva administration  David Hoffman, PharmD Pharmacy Resident 08/11/2021, 3:55 PM

## 2021-08-22 ENCOUNTER — Other Ambulatory Visit (HOSPITAL_COMMUNITY): Payer: Self-pay

## 2021-09-15 ENCOUNTER — Other Ambulatory Visit: Payer: Self-pay | Admitting: Pharmacist

## 2021-09-15 ENCOUNTER — Other Ambulatory Visit (HOSPITAL_COMMUNITY): Payer: Self-pay

## 2021-09-15 DIAGNOSIS — B20 Human immunodeficiency virus [HIV] disease: Secondary | ICD-10-CM

## 2021-09-15 MED ORDER — CABOTEGRAVIR & RILPIVIRINE ER 600 & 900 MG/3ML IM SUER
1.0000 | INTRAMUSCULAR | 5 refills | Status: AC
  Filled 2021-09-15 (×2): qty 6, 60d supply, fill #0
  Filled 2021-11-27: qty 6, 60d supply, fill #1
  Filled 2022-01-19: qty 6, 60d supply, fill #2

## 2021-09-18 ENCOUNTER — Telehealth: Payer: Self-pay

## 2021-09-18 NOTE — Telephone Encounter (Signed)
[facility] Patient Advocate Encounter  Patient's medication Renaldo Harrison) have been couriered to [facility] from [facility] and will be administered on patient next office visit on 09/25/21.  Clearance Coots , CPhT Specialty Pharmacy Patient [facility] for Infectious Disease Phone: [phone] Fax:  [phone]

## 2021-09-25 ENCOUNTER — Encounter: Payer: Self-pay | Admitting: Pharmacist

## 2021-10-02 ENCOUNTER — Ambulatory Visit (INDEPENDENT_AMBULATORY_CARE_PROVIDER_SITE_OTHER): Payer: BC Managed Care – PPO | Admitting: Pharmacist

## 2021-10-02 ENCOUNTER — Other Ambulatory Visit: Payer: Self-pay

## 2021-10-02 DIAGNOSIS — B20 Human immunodeficiency virus [HIV] disease: Secondary | ICD-10-CM | POA: Diagnosis not present

## 2021-10-02 DIAGNOSIS — Z113 Encounter for screening for infections with a predominantly sexual mode of transmission: Secondary | ICD-10-CM

## 2021-10-02 MED ORDER — CABOTEGRAVIR & RILPIVIRINE ER 600 & 900 MG/3ML IM SUER
1.0000 | Freq: Once | INTRAMUSCULAR | Status: AC
  Administered 2021-10-02: 1 via INTRAMUSCULAR

## 2021-10-03 NOTE — Progress Notes (Signed)
? ?HPI: David Hoffman is a 31 y.o. male who presents to the [place] clinic for [place] administration. ? ?Patient Active Problem List  ? Diagnosis Date Noted  ? Tinea corporis 01/02/2021  ? Routine screening for STI (sexually transmitted infection) 07/18/2020  ? History of syphilis 10/29/2019  ? Candidiasis of mouth 10/24/2019  ? Abdominal pain 07/08/2019  ? External hemorrhoid 05/13/2019  ? Gastroenteritis presumed infectious 10/26/2015  ? Oral lesion 10/26/2015  ? Cervical lymphadenopathy 10/26/2015  ? Cervical lymphadenitis   ? Headache above the eye region   ? 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 Gonorrhea 01/19/2015  ? 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 Polyarthritis 01/19/2015  ? HIV disease ([place]) 11/30/2014  ? Nausea without vomiting 11/30/2014  ? ? ?Patient's Medications  ?New Prescriptions  ? No medications on file  ?Previous Medications  ? CABOTEGRAVIR & RILPIVIRINE ER (CABENUVA) 600 & 900 MG/3ML INJECTION    Inject 1 kit into the muscle every 2 (two) months.  ? CLOTRIMAZOLE (LOTRIMIN) 1 % CREAM    Apply 1 application topically 2 (two) times daily.  ? DOXYCYCLINE (VIBRA-TABS) 100 MG TABLET    Take 1 tablet (100 mg total) by mouth 2 (two) times daily.  ?Modified Medications  ? No medications on file  ?Discontinued Medications  ? CABOTEGRAVIR & RILPIVIRINE ER (CABENUVA) 600 & 900 MG/3ML INJECTION    Inject 1 kit into the muscle every 2 (two) months.  ? ? ?Allergies: ?No Known Allergies ? ?Past Medical History: ?Past Medical History:  ?Diagnosis Date  ? Abdominal pain 07/08/2019  ? Cervical lymphadenopathy 10/26/2015  ? 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 Gastroenteritis presumed infectious 10/26/2015  ? Gonorrhea 01/19/2015  ? History of gonorrhea   ? HIV disease [facility]) march 2016 dx  ? Oral lesion 10/26/2015  ? 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 Routine screening for STI (sexually transmitted infection) 07/18/2020  ? Tinea corporis 01/02/2021   ? Tinea pedis   ? ? ?Social History: ?Social History  ? ?Socioeconomic History  ? Marital status: Single  ?  Spouse name: Not on file  ? Number of children: Not on file  ? Years of education: Not on file  ? Highest education level: Not on file  ?Occupational History  ? Not on file  ?Tobacco Use  ? Smoking status: Every Day  ?  Types: Cigars  ?  Last attempt to quit: 11/09/2014  ?  Years since quitting: 6.9  ? Smokeless tobacco: Never  ?Substance and Sexual Activity  ? Alcohol use: Yes  ?  Alcohol/week: 0.0 standard drinks  ?  Comment: rare use  ? Drug use: Yes  ?  Types: Marijuana  ?  Comment: daily  ? Sexual activity: Yes  ?  Partners: Male  ?  Comment: offered condoms 06/2019  ?Other Topics Concern  ? Not on file  ?Social History Narrative  ? Not on file  ? ?Social Determinants of Health  ? ?Financial Resource Strain: Not on file  ?Food Insecurity: Not on file  ?Transportation Needs: Not on file  ?Physical Activity: Not on file  ?Stress: Not on file  ?Social Connections: Not on file  ? ? ?Labs: ?Lab Results  ?Component Value Date  ? HIV1RNAQUANT Not Detected 07/27/2021  ? HIV1RNAQUANT <20 (H) 05/25/2021  ? HIV1RNAQUANT <20 (H) 03/27/2021  ? HIV1RNAVL <20 05/13/2019  ? HIV1RNAVL <20 02/16/2019  ? HIV1RNAVL 32 04/07/2018  ? CD4TABS 1,265 05/25/2021  ? CD4TABS 1,355 10/10/2020  ?  CD4TABS 1,097 12/30/2019  ? ? ?RPR and STI ?Lab Results  ?Component Value Date  ? LABRPR REACTIVE (A) 07/27/2021  ? LABRPR REACTIVE (A) 05/25/2021  ? LABRPR NON-REACTIVE 03/27/2021  ? LABRPR REACTIVE (A) 07/18/2020  ? LABRPR REACTIVE (A) 12/30/2019  ? RPRTITER 1:2 (H) 07/27/2021  ? RPRTITER 1:1 (H) 05/25/2021  ? RPRTITER 1:2 (H) 07/18/2020  ? RPRTITER 1:2 (H) 12/30/2019  ? RPRTITER 1:1 (H) 07/08/2019  ? ? ?Hepatitis B ?Lab Results  ?Component Value Date  ? HEPBSAB Positive 10/18/2017  ? HEPBSAG NEGATIVE 10/28/2014  ? HEPBCAB Negative 10/18/2017  ? ?Hepatitis C ?Lab Results  ?Component Value Date  ? HEPCAB Negative 10/18/2017  ? ?Hepatitis  A ?Lab Results  ?Component Value Date  ? HAV NON REACTIVE 10/28/2014  ? ?Lipids: ?Lab Results  ?Component Value Date  ? CHOL 144 12/08/2020  ? TRIG 75 12/08/2020  ? HDL 52 12/08/2020  ? CHOLHDL 3.6 10/28/2014  ? VLDL 20 10/28/2014  ? [place] 77 12/08/2020  ? ? ?TARGET DATE: ?The 29th ? ?Assessment: ?Lan presents today for their maintenance Cabenuva injections. Past injections were tolerated well without issues. No problems with systemic side effects of injections. He tested positive for rectal gonorrhea and pharyngeal gonorrhea and chlamydia at last visit in December. Completed treatment with one week of doxycycline and ceftriaxone 500 mg IM x 1. No new partners since then. He would like to be retested so will repeat labs today, including a HIV RNA.  ? ?Administered cabotegravir 672m/3mL in left upper outer quadrant of the gluteal muscle. Administered rilpivirine 900 mg/331min the right upper outer quadrant of the gluteal muscle. Monitored patient for 10 minutes after injection. Injections were tolerated well without issue. Patient will follow up in 2 months for next set of injections. ? ?Plan: ?- Cabenuva injections administered ?- HIV RNA, RPR, and oral/rectal/urine cytology today ?- Next injections scheduled for 11/27/21 with Dr. VaTommy Medalnd 7/5 with me ?- Call with any issues or questions ? ?Cassie L. Kuppelweiser, PharmD, BCIDP, AAHIVP, CPP ?Clinical Pharmacist Practitioner ?Infectious Diseases Clinical Pharmacist ?[facility]  ?

## 2021-10-05 LAB — CT/NG RNA, TMA RECTAL
Chlamydia Trachomatis RNA: NOT DETECTED
Neisseria Gonorrhoeae RNA: NOT DETECTED

## 2021-10-05 LAB — HIV-1 RNA QUANT-NO REFLEX-BLD
HIV 1 RNA Quant: NOT DETECTED Copies/mL
HIV-1 RNA Quant, Log: NOT DETECTED Log cps/mL

## 2021-10-05 LAB — RPR TITER: RPR Titer: 1:1 {titer} — ABNORMAL HIGH

## 2021-10-05 LAB — C. TRACHOMATIS/N. GONORRHOEAE RNA
C. trachomatis RNA, TMA: NOT DETECTED
N. gonorrhoeae RNA, TMA: NOT DETECTED

## 2021-10-05 LAB — GC/CHLAMYDIA PROBE, AMP (THROAT)
Chlamydia trachomatis RNA: NOT DETECTED
Neisseria gonorrhoeae RNA: NOT DETECTED

## 2021-10-05 LAB — FLUORESCENT TREPONEMAL AB(FTA)-IGG-BLD: Fluorescent Treponemal ABS: REACTIVE — AB

## 2021-10-05 LAB — RPR: RPR Ser Ql: REACTIVE — AB

## 2021-11-27 ENCOUNTER — Encounter: Payer: BC Managed Care – PPO | Admitting: Infectious Disease

## 2021-11-27 ENCOUNTER — Other Ambulatory Visit (HOSPITAL_COMMUNITY): Payer: Self-pay

## 2021-11-28 ENCOUNTER — Telehealth: Payer: Self-pay

## 2021-11-28 NOTE — Telephone Encounter (Signed)
[facility] Patient Advocate Encounter ? ?Patient's medication Renaldo Harrison) have been couriered to [facility] from [employer] [employer] [facility] and will be administered on the patient next office visit on 02/06/22. ? ?Clearance Coots , CPhT ?Specialty Pharmacy Patient Advocate ?[facility] ?Phone: [phone] ?Fax:  [phone]  ?

## 2021-12-18 ENCOUNTER — Telehealth: Payer: Self-pay

## 2021-12-18 NOTE — Telephone Encounter (Signed)
Patient called and stated he wrote down the incorrect date for his last Cabenuva appointment. Per Marchelle Folks, patient outside window by approximately 1 month; ok to reschedule asap.   Rescheduled with Dr. Daiva Eves on 12/22/21 per patient request.   Wyvonne Lenz, RN

## 2021-12-22 ENCOUNTER — Encounter: Payer: Self-pay | Admitting: Infectious Disease

## 2021-12-22 ENCOUNTER — Ambulatory Visit (INDEPENDENT_AMBULATORY_CARE_PROVIDER_SITE_OTHER): Payer: Self-pay | Admitting: Infectious Disease

## 2021-12-22 ENCOUNTER — Other Ambulatory Visit: Payer: Self-pay

## 2021-12-22 ENCOUNTER — Other Ambulatory Visit (HOSPITAL_COMMUNITY)
Admission: RE | Admit: 2021-12-22 | Discharge: 2021-12-22 | Disposition: A | Discharge status: Home / Self Care | Payer: Self-pay | Source: Ambulatory Visit | Attending: Infectious Disease | Admitting: Infectious Disease

## 2021-12-22 VITALS — BP 113/71 | HR 68 | Temp 97.7°F | Wt 161.0 lb

## 2021-12-22 DIAGNOSIS — Z113 Encounter for screening for infections with a predominantly sexual mode of transmission: Secondary | ICD-10-CM

## 2021-12-22 DIAGNOSIS — B20 Human immunodeficiency virus [HIV] disease: Secondary | ICD-10-CM

## 2021-12-22 MED ORDER — CABOTEGRAVIR & RILPIVIRINE ER 600 & 900 MG/3ML IM SUER
1.0000 | Freq: Once | INTRAMUSCULAR | Status: AC
  Administered 2021-12-22: 1 via INTRAMUSCULAR

## 2021-12-22 NOTE — Progress Notes (Signed)
Subjective:  Chief complaint here to receive Cabenuva every 2 monthly  Patient ID: David Hoffman, male    DOB: Jan 08, 1991, 31 y.o.   MRN: [id number]  HPI  [name] is a 31 year old 67 man with HIV that has been on [place] every 2 monthly injections but he missed his injection visit on May 1.  His last injection was on March 6 so that he is beyond his 2-week window but less than 2 months since his last injection.  He also would like to be retested for gonorrhea chlamydia syphilis.  He is living in [place] right now which make him difficult to get to his appointments.  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  Family History  Problem Relation Age of Onset   Diabetes Mother    Schizophrenia Maternal Grandmother       Social History   Socioeconomic History   Marital status: Single    Spouse name: Not on file   Number of children: Not on file   Years of education: Not on file   Highest education level: Not on file  Occupational History   Not on file  Tobacco Use   Smoking status: Every Day    Types: Cigars    Last attempt to quit: 11/09/2014    Years since quitting: 7.1   Smokeless tobacco: Never  Substance and Sexual Activity   Alcohol use: Yes    Alcohol/week: 0.0 standard drinks    Comment: rare use   Drug use: Yes    Types: Marijuana    Comment: daily   Sexual activity: Yes    Partners:  Male    Comment: offered condoms 06/2019  Other Topics Concern   Not on file  Social History Narrative   Not on file   Social Determinants of Health   Financial Resource Strain: Not on file  Food Insecurity: Not on file  Transportation Needs: Not on file  Physical Activity: Not on file  Stress: Not on file  Social Connections: Not on file    No Known Allergies   Current Outpatient Medications:    cabotegravir & rilpivirine ER (CABENUVA) 600 & 900 MG/3ML injection, Inject 1 kit into the muscle every 2 (two) months., Disp: 6 mL, Rfl: 5   clotrimazole (LOTRIMIN) 1 % cream, Apply 1 application topically 2 (two) times daily., Disp: 30 g, Rfl: 5   doxycycline (VIBRA-TABS) 100 MG tablet, Take 1 tablet (100 mg total) by mouth 2 (two) times daily., Disp: 14 tablet, Rfl: 0   Review of Systems  Constitutional:  Negative for activity change, appetite change, chills, diaphoresis, fatigue, fever and unexpected weight change.  HENT:  Negative for congestion, rhinorrhea, sinus pressure, sneezing, sore throat and trouble swallowing.   Eyes:  Negative for photophobia and visual disturbance.  Respiratory:  Negative for cough, chest tightness, shortness of breath, wheezing and stridor.   Cardiovascular:  Negative for chest pain, palpitations and leg swelling.  Gastrointestinal:  Negative for abdominal distention, abdominal pain, anal bleeding, blood in stool, constipation, diarrhea, nausea and vomiting.  Genitourinary:  Negative for difficulty urinating, dysuria, flank pain and hematuria.  Musculoskeletal:  Negative for arthralgias, back pain, gait problem, joint swelling and myalgias.  Skin:  Negative for color change, pallor, rash and wound.  Neurological:  Negative for dizziness, tremors, weakness and light-headedness.  Hematological:  Negative for adenopathy. Does not bruise/bleed easily.  Psychiatric/Behavioral:  Negative for agitation, behavioral problems, confusion, decreased concentration,  dysphoric mood and sleep disturbance.       Objective:   Physical Exam Constitutional:      Appearance: He is well-developed.  HENT:     Head: Normocephalic and atraumatic.  Eyes:     Conjunctiva/sclera: Conjunctivae normal.  Cardiovascular:     Rate and Rhythm: Normal rate and regular rhythm.  Pulmonary:     Effort: Pulmonary effort is normal. No respiratory distress.     Breath sounds: No wheezing.  Abdominal:     General: There is no distension.     Palpations: Abdomen is soft.  Musculoskeletal:        General: No tenderness. Normal range of motion.     Cervical back: Normal range of motion and neck supple.  Skin:    General: Skin is warm and dry.     Coloration: Skin is not pale.     Findings: No erythema or rash.  Neurological:     General: No focal deficit present.     Mental Status: He is alert and oriented to person, place, and time.  Psychiatric:        Mood and Affect: Mood normal.        Behavior: Behavior normal.        Thought Content: Thought content normal.        Judgment: Judgment normal.          Assessment & Plan:  HIV disease:  Hopefully he is not going to fail resistance due to missed injection.  We will check an HIV viral load with reflex to genotype today and CD4 count.  He is get given Cabenuva injection today and I have scheduled him for his next visit 2 weeks earlier than his target date so that we will give him more forgiveness to potentially miss appointments before he runs out of the 2-week period of forgiveness  Think he should establish care in [place] if possible since that is where he is now living and driving an hour and a half to get here every 2 months does not seem terribly convenient STI screening screen for gonorrhea chlamydia genital X genital sites and check syphilis titer.

## 2021-12-22 NOTE — Patient Instructions (Signed)
Rodell Perna MD and his [place] [address] [place], [place] [address] Directions [place], MD [facility]  [phone]

## 2021-12-26 LAB — URINE CYTOLOGY ANCILLARY ONLY
Chlamydia: NEGATIVE
Comment: NEGATIVE
Comment: NORMAL
Neisseria Gonorrhea: NEGATIVE

## 2021-12-26 LAB — CYTOLOGY, (ORAL, ANAL, URETHRAL) ANCILLARY ONLY
Chlamydia: NEGATIVE
Chlamydia: NEGATIVE
Comment: NEGATIVE
Comment: NEGATIVE
Comment: NORMAL
Comment: NORMAL
Neisseria Gonorrhea: NEGATIVE
Neisseria Gonorrhea: NEGATIVE

## 2021-12-27 LAB — RPR TITER: RPR Titer: 1:1 {titer} — ABNORMAL HIGH

## 2021-12-27 LAB — HIV RNA, RTPCR W/R GT (RTI, PI,INT)
HIV 1 RNA Quant: <20 copies/mL — AB
HIV-1 RNA Quant, Log: <1.3 Log copies/mL — AB

## 2021-12-27 LAB — FLUORESCENT TREPONEMAL AB(FTA)-IGG-BLD: Fluorescent Treponemal ABS: REACTIVE — AB

## 2021-12-27 LAB — RPR: RPR Ser Ql: REACTIVE — AB

## 2022-01-19 ENCOUNTER — Other Ambulatory Visit (HOSPITAL_COMMUNITY): Payer: Self-pay

## 2022-01-22 ENCOUNTER — Other Ambulatory Visit (HOSPITAL_COMMUNITY): Payer: Self-pay

## 2022-01-25 ENCOUNTER — Telehealth: Payer: Self-pay

## 2022-01-25 NOTE — Telephone Encounter (Signed)
[facility] Patient Advocate Encounter  Completed and sent [employer] patient Assistance application for Cabenuva for this patient who is uninsured.    Patient is approved 01/25/22 through 01/26/23.  Medication will be shipped to the clinic.   Clearance Coots, CPhT Specialty Pharmacy Patient [facility] for Infectious Disease Phone: [phone] Fax:  [phone]

## 2022-01-31 ENCOUNTER — Encounter: Payer: BC Managed Care – PPO | Admitting: Pharmacist

## 2022-02-01 ENCOUNTER — Telehealth: Payer: Self-pay

## 2022-02-01 NOTE — Telephone Encounter (Signed)
[facility] Patient Advocate Encounter  Patient's medication(Cabenuva) have been couriered to [facility] from [employer] and will be administered on the patient next office visit on 02/07/22.   Patient Supplied([employer])   Clearance Coots , CPhT Specialty Pharmacy Patient [facility] for [facility] Disease Phone: [phone] Fax:  [phone]

## 2022-02-02 ENCOUNTER — Telehealth: Payer: Self-pay

## 2022-02-02 ENCOUNTER — Other Ambulatory Visit (HOSPITAL_COMMUNITY): Payer: Self-pay

## 2022-02-02 NOTE — Telephone Encounter (Signed)
[facility] Patient Advocate Encounter  Patient's medication David Hoffman) have been couriered to [facility] from [employer] and will be administered on the patient next office visit on 02/07/22.  Clearance Coots , CPhT Specialty Pharmacy Patient [facility] for Infectious Disease Phone: [phone] Fax:  [phone]

## 2022-02-07 ENCOUNTER — Encounter: Payer: Self-pay | Admitting: Pharmacist

## 2022-02-07 ENCOUNTER — Ambulatory Visit: Payer: Self-pay

## 2022-02-27 ENCOUNTER — Telehealth: Payer: Self-pay | Admitting: Pharmacist

## 2022-02-27 ENCOUNTER — Ambulatory Visit: Payer: Self-pay

## 2022-02-27 ENCOUNTER — Encounter: Payer: Self-pay | Admitting: Pharmacist

## 2022-02-27 NOTE — Telephone Encounter (Signed)
Patient missed Cabenuva injection in May and was almost 1 month late receiving it on May 26th with Dr. Daiva Eves. He then missed his injection appointment on 02/07/22 and today. Discussed with Dr. Daiva Eves. Patient is no longer a good candidate for [facility] and will need to be switched back to oral therapy. He lives in [place] so it would probably be best for him to find an ID provider there. Called patient to discuss. No answer, VM box full.   I have asked that he not be rescheduled for an appointment with [place] until we talk to him.   Jarrad Mclees L. Forrestine Lecrone, PharmD, BCIDP, AAHIVP, CPP Clinical Pharmacist Practitioner Infectious Diseases Clinical Pharmacist [facility] 02/27/2022, 9:51 AM

## 2022-03-05 ENCOUNTER — Other Ambulatory Visit (HOSPITAL_COMMUNITY): Payer: Self-pay

## 2022-03-05 ENCOUNTER — Telehealth: Payer: Self-pay | Admitting: Pharmacist

## 2022-03-05 ENCOUNTER — Telehealth: Payer: Self-pay

## 2022-03-05 NOTE — Telephone Encounter (Signed)
Received call today from patient requesting referral for [facility] in [place], [place]. Will forward message to MD for referral. Requested patient have release of medical record be faxed to office to ensure medical records are sent.  Juanita Laster, RMA

## 2022-03-05 NOTE — Telephone Encounter (Signed)
Patient called to reschedule appointment for Cabenuva injections. He was due for his injection on 6/29 but no showed twice since then. Dr. Daiva Eves and I made the decision to not continue Cabenuva as he often misses his appointments and lives in [place]. I alerted him that Dr. Daiva Eves would like him to transition back to oral treatment. He wants to continue [place] at a clinic in [place]. I told him I wasn't sure if they offer Cabenuva at their clinic(s) but told him it may be a good idea to transfer care there anyway as it is closer and easier for him to make appointments. Discussed, again, the importance of making injection appointments and staying within window. He was due for injections on 4/29 and did not receive until 5/26. It is recommended to stay on original schedule, so he again missed his injection window around 6/29 (no showed on 7/12 and 8/1). He would have been late anyway on 7/12.   I transferred him to triage so they could tell him what needed to be done to transfer clinics.  He is also uninsured so would need [employer] as well.   Oniya Mandarino L. Calvyn Kurtzman, PharmD [facility] Clinical Pharmacist Practitioner

## 2022-03-06 ENCOUNTER — Other Ambulatory Visit: Payer: Self-pay | Admitting: Infectious Disease

## 2022-03-06 DIAGNOSIS — B20 Human immunodeficiency virus [HIV] disease: Secondary | ICD-10-CM

## 2022-03-06 NOTE — Telephone Encounter (Signed)
Correct. Patient past due for cab injection.   Juanita Laster, RMA

## 2022-03-06 NOTE — Telephone Encounter (Signed)
Spoke with patient who is okay with having prescription of Symtuza being sent to [facility] in [place]. Juanita Laster, RMA

## 2022-03-07 ENCOUNTER — Other Ambulatory Visit: Payer: Self-pay | Admitting: Infectious Disease

## 2022-03-07 MED ORDER — SYMTUZA 800-150-200-10 MG PO TABS: 1.0000 | ORAL_TABLET | Freq: Every day | ORAL | 5 refills | Status: AC

## 2022-03-14 ENCOUNTER — Telehealth: Payer: Self-pay

## 2022-03-14 ENCOUNTER — Ambulatory Visit: Payer: Self-pay | Admitting: Infectious Disease

## 2022-03-14 NOTE — Telephone Encounter (Signed)
[facility] Patient Advocate Encounter  Patient's medications have been couriered to [facility] from  [facility] , please see Cassie's note from 02/27/2022.

## 2023-03-01 ENCOUNTER — Other Ambulatory Visit: Payer: Self-pay
